# Patient Record
Sex: Male | Born: 1960 | Race: Black or African American | Hispanic: No | Marital: Married
Health system: Southern US, Community
[De-identification: ages and names within clinical notes are randomized; demographics above are authoritative.]

## PROBLEM LIST (undated history)

## (undated) DIAGNOSIS — I509 Heart failure, unspecified: Secondary | ICD-10-CM

## (undated) DIAGNOSIS — C649 Malignant neoplasm of unspecified kidney, except renal pelvis: Secondary | ICD-10-CM

## (undated) DIAGNOSIS — I639 Cerebral infarction, unspecified: Secondary | ICD-10-CM

---

## 2005-12-11 DIAGNOSIS — I10 Essential (primary) hypertension: Secondary | ICD-10-CM | POA: Diagnosis present

## 2007-10-29 DIAGNOSIS — Z9889 Other specified postprocedural states: Secondary | ICD-10-CM | POA: Insufficient documentation

## 2009-03-10 ENCOUNTER — Emergency Department: Payer: Self-pay | Admitting: Emergency Medicine

## 2010-03-18 IMAGING — CT CT HEAD WITHOUT CONTRAST
1 series · 16 of 30 positions shown, 20 images · non-contrast
Comparison: none

REASON FOR EXAM: seizure
COMMENTS:

PROCEDURE:     CT  - CT HEAD WITHOUT CONTRAST  - March 10, 2009  [DATE]
RESULT:     Comparison: None
TECHNIQUE: Multiple axial images from the foramen magnum to the vertex were
obtained without IV contrast.

[Series 6: without_ · axial · 0.44mm/px · z∈[+1092,+1232]mm · 16 of 32 slices shown, 20 images]
[im 2/32  brain]
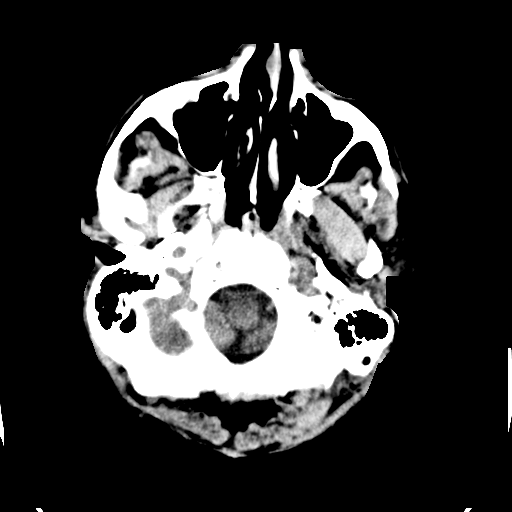
[im 2/32  bone]
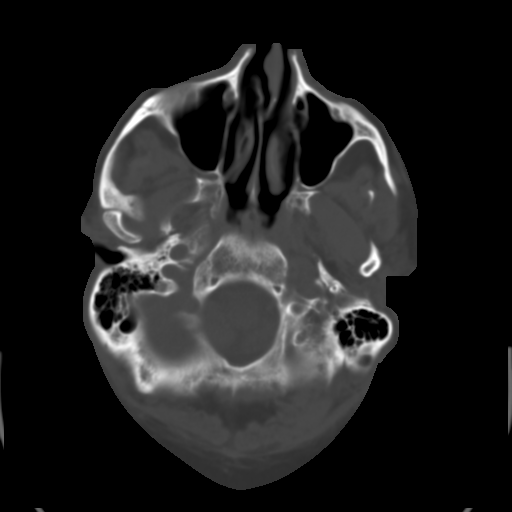
[im 4/32  brain]
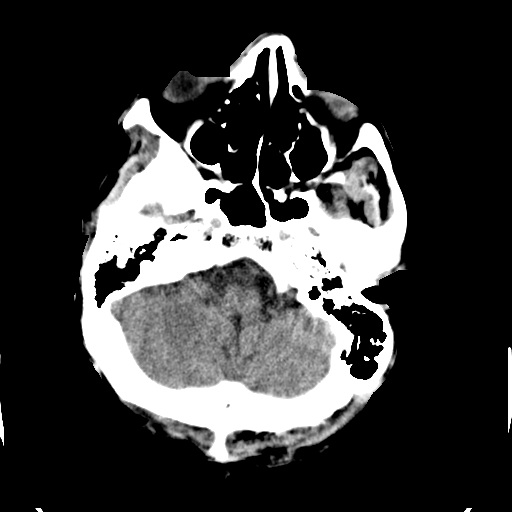
[im 6/32  brain]
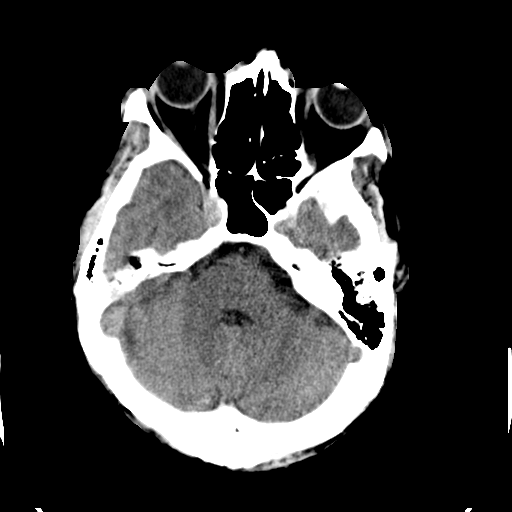
[im 8/32  brain]
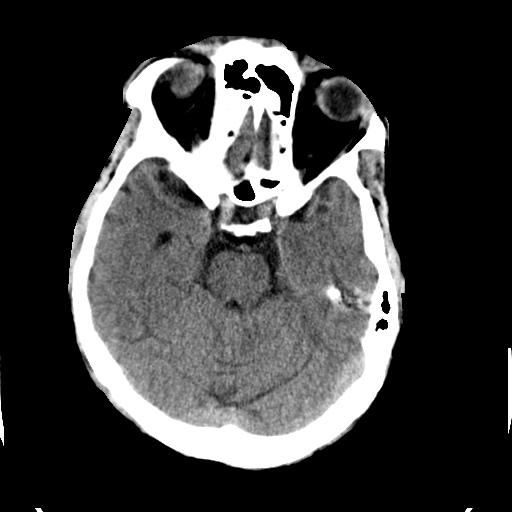
[im 9/32  brain]
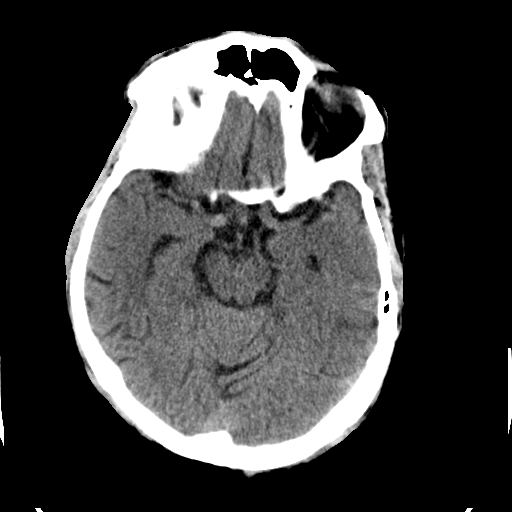
[im 9/32  bone]
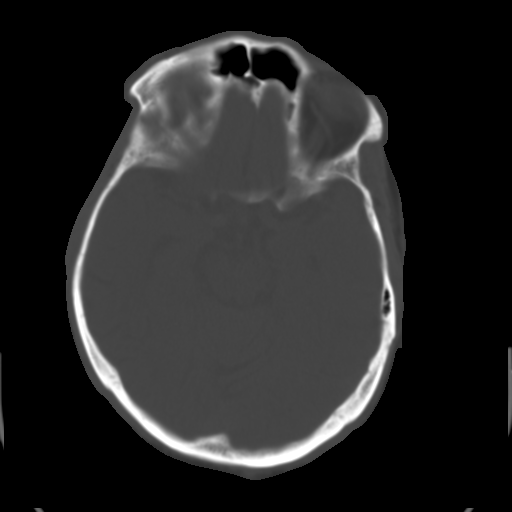
[im 11/32  brain]
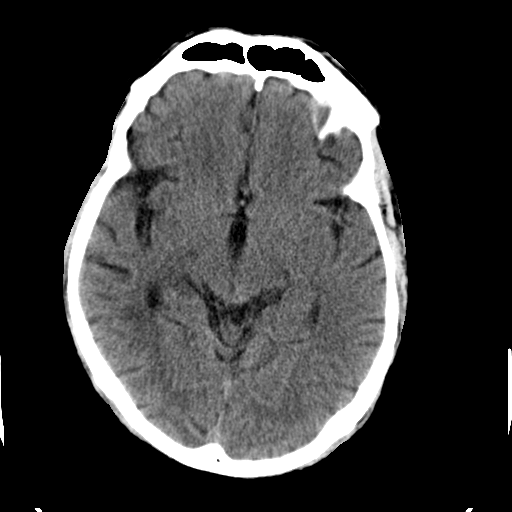
[im 13/32  brain]
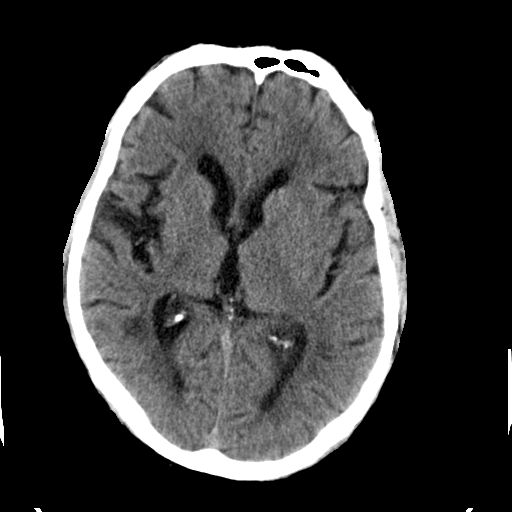
[im 15/32  brain]
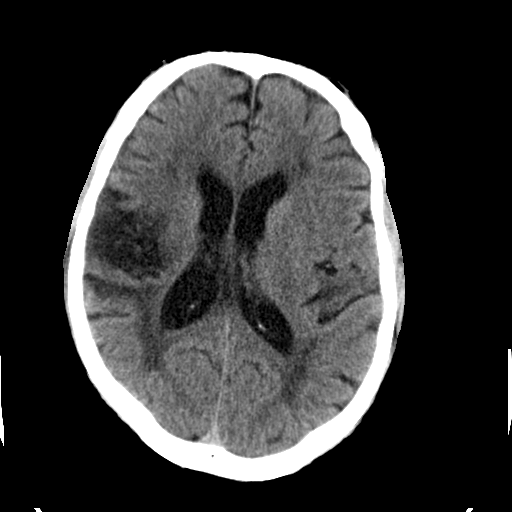
[im 17/32  brain]
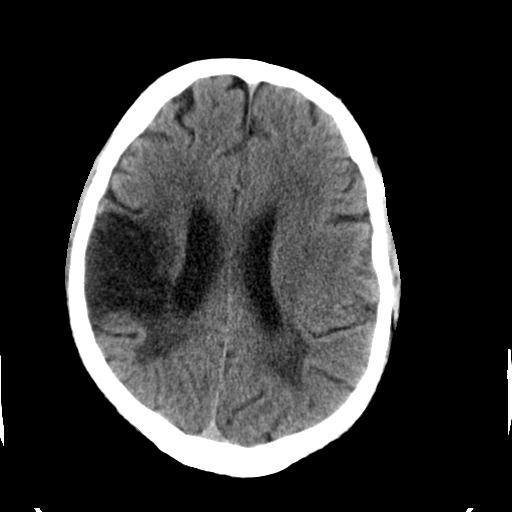
[im 17/32  bone]
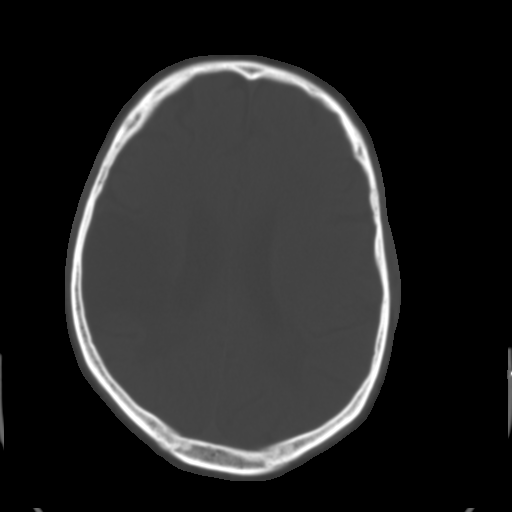
[im 19/32  brain]
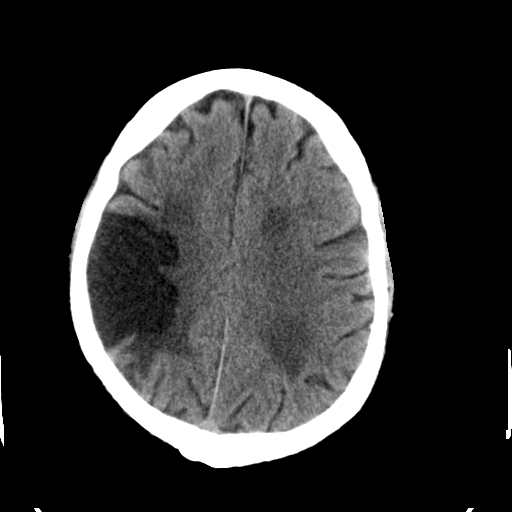
[im 21/32  brain]
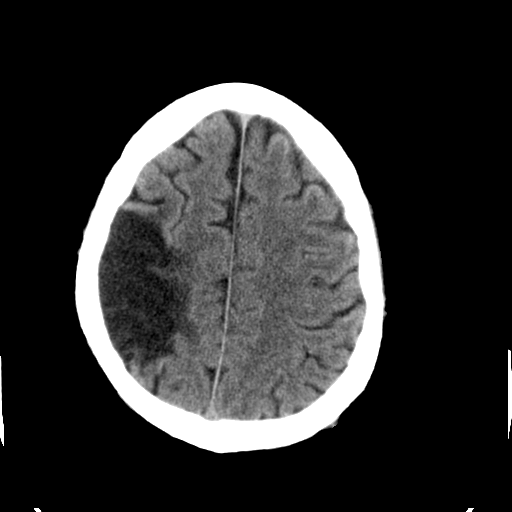
[im 23/32  brain]
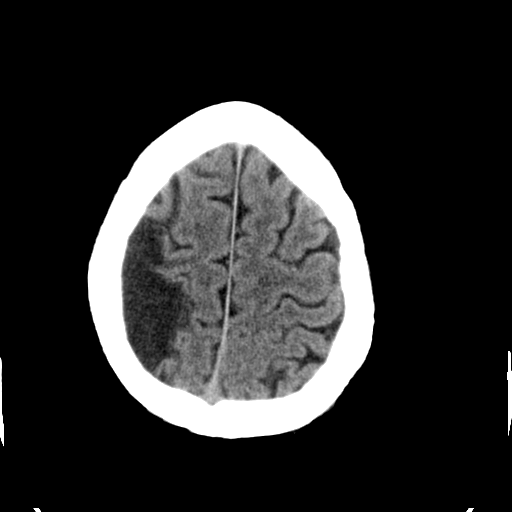
[im 24/32  brain]
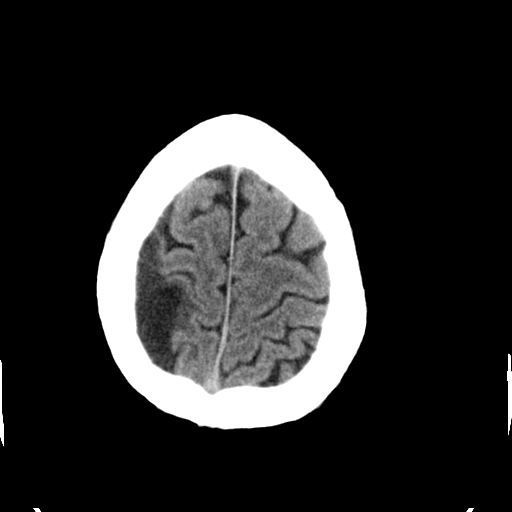
[im 24/32  bone]
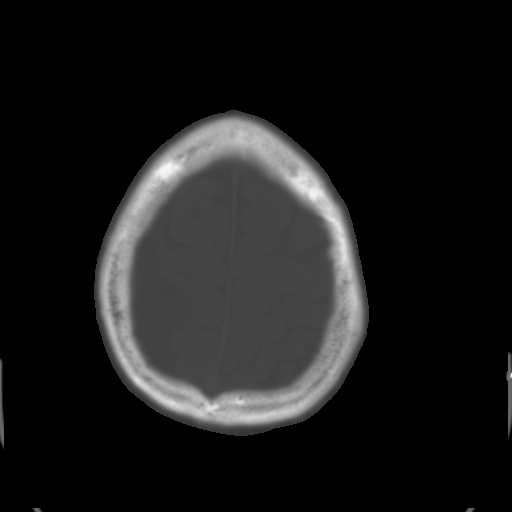
[im 26/32  brain]
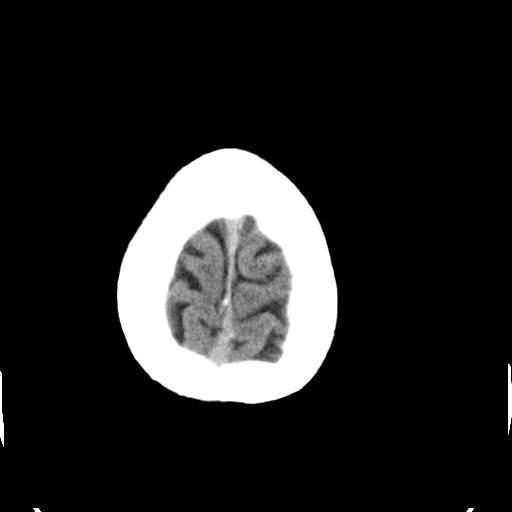
[im 28/32  brain]
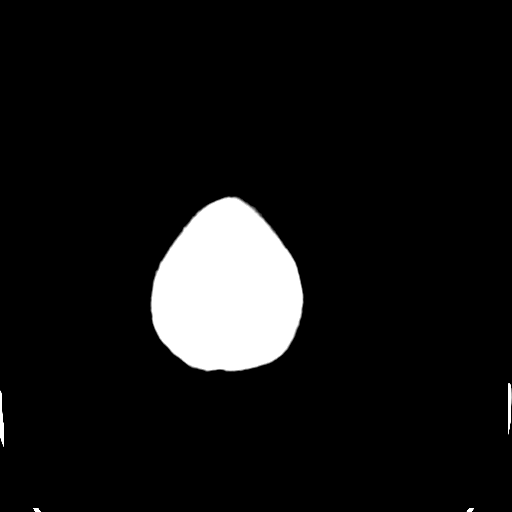
[im 30/32  brain]
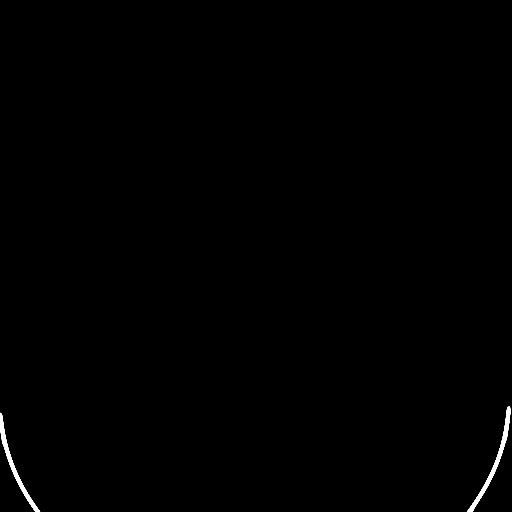

[16 of 30 positions shown; findings below may reference images not displayed]

FINDINGS: There is no evidence for mass effect, midline shift, or extra-axial fluid
collections. There is generalized cerebral atrophy. There is periventricular
white matter low attenuation likely secondary to microangiopathy. There is
no evidence for space-occupying lesion or intracranial hemorrhage. There is
no evidence for cortical-based area of infarction. There is an area of
low-attenuation in the right frontoparietal lobe consistent with
encephalomalacia.

Ventricles and sulci are appropriate for the patient's age. The basal
cisterns are patent.

Visualized portions of the orbits are unremarkable. The paranasal sinuses
and mastoid air cells are unremarkable.

The osseous structures are unremarkable.
IMPRESSION: No acute intracranial process.

## 2010-08-03 ENCOUNTER — Emergency Department: Payer: Self-pay | Admitting: Emergency Medicine

## 2011-08-11 IMAGING — CT CT HEAD WITHOUT CONTRAST
2 series · 16 of 30 positions shown, 20 images · non-contrast
Comparison: none

REASON FOR EXAM: SEIZURE
COMMENTS:

[Series 2: without · axial · non-contrast · 0.45mm/px · z∈[+185,+315]mm · 13 of 32 slices shown, 17 images]
[im 3/32  brain]
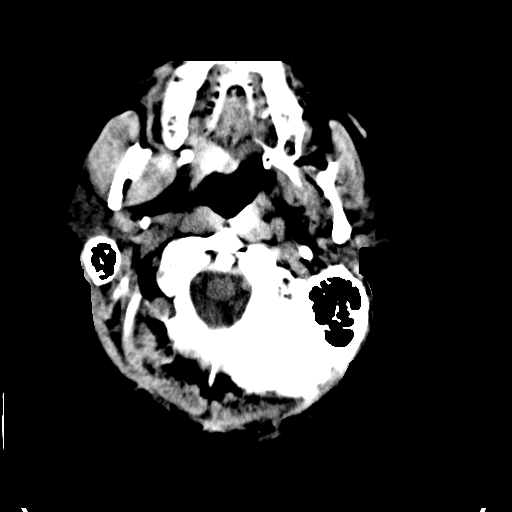
[im 3/32  bone]
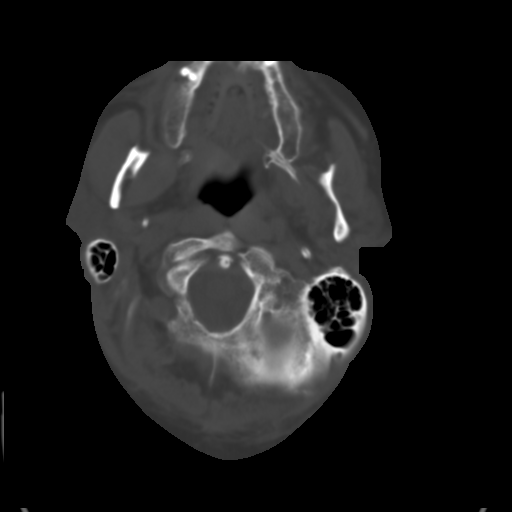
[im 5/32  brain]
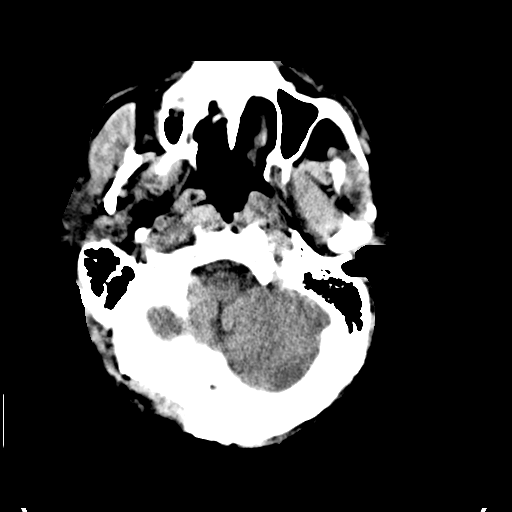
[im 7/32  brain]
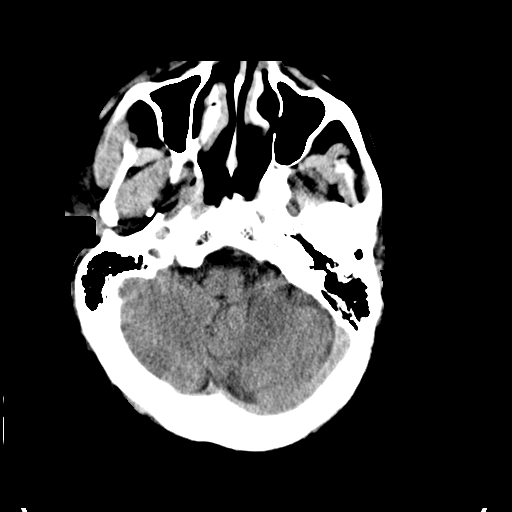
[im 9/32  brain]
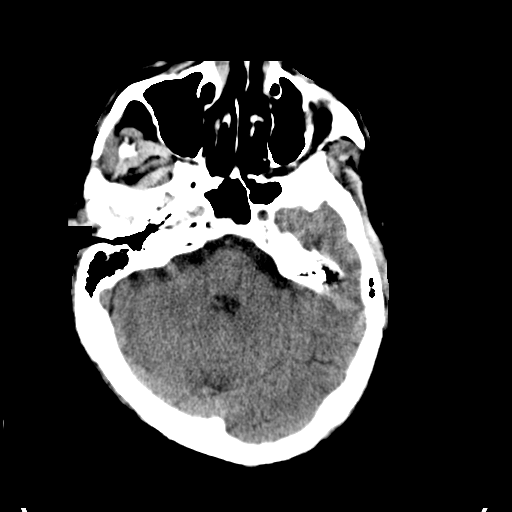
[im 12/32  brain]
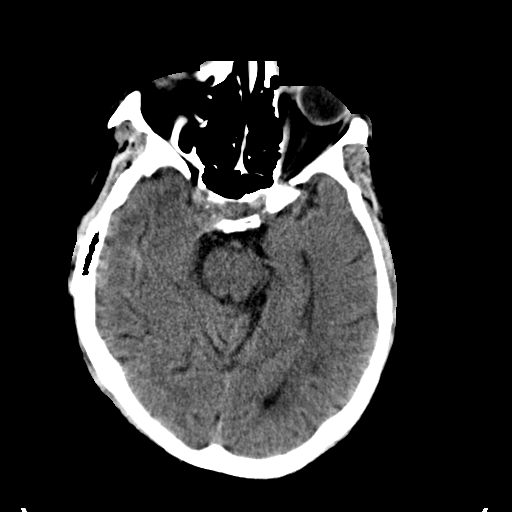
[im 12/32  bone]
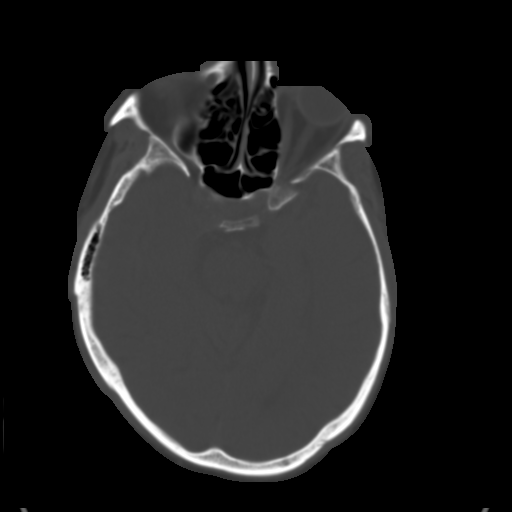
[im 14/32  brain]
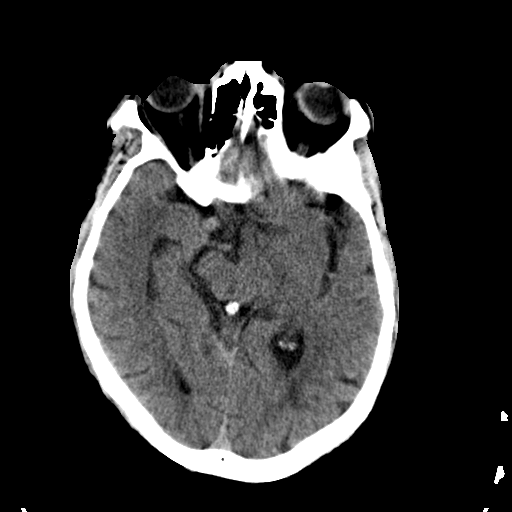
[im 16/32  brain]
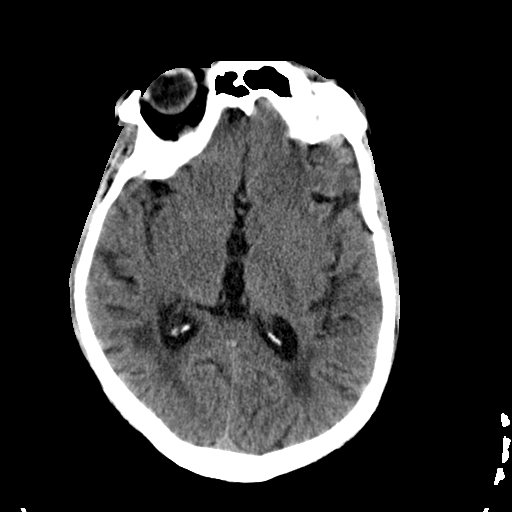
[im 18/32  brain]
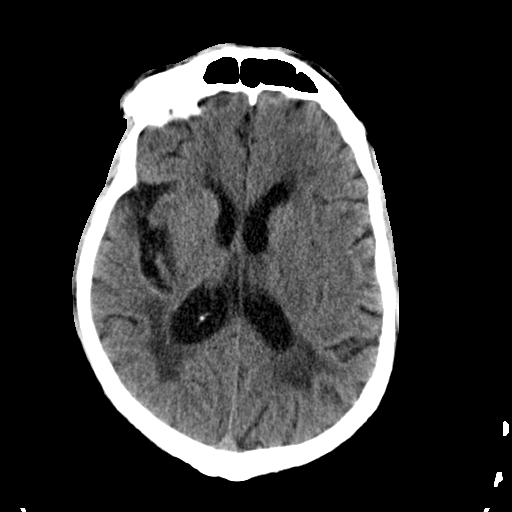
[im 20/32  brain]
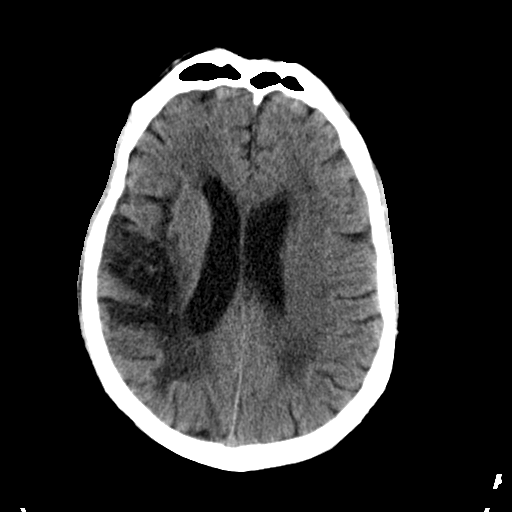
[im 20/32  bone]
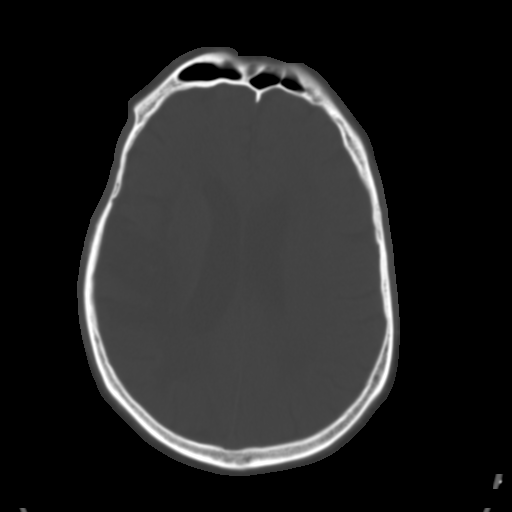
[im 23/32  brain]
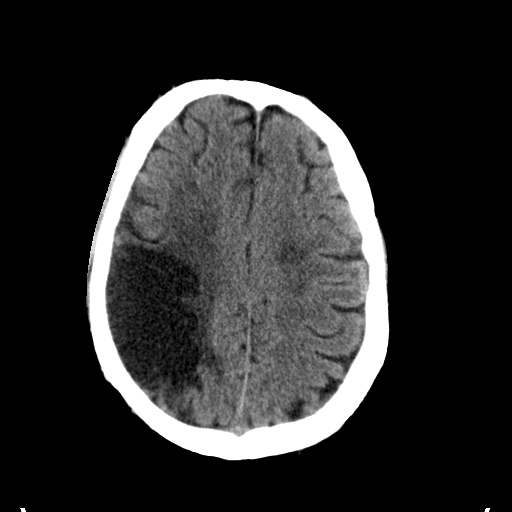
[im 25/32  brain]
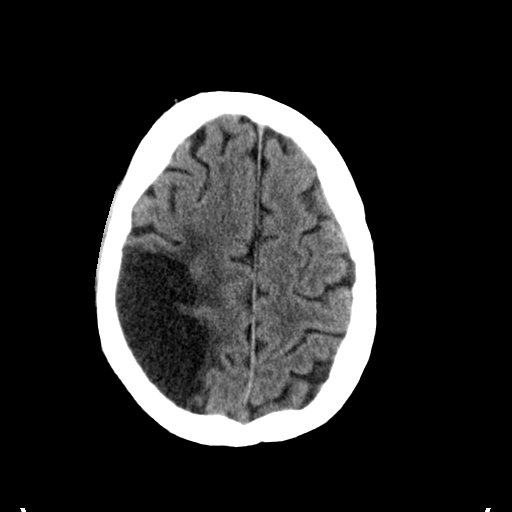
[im 27/32  brain]
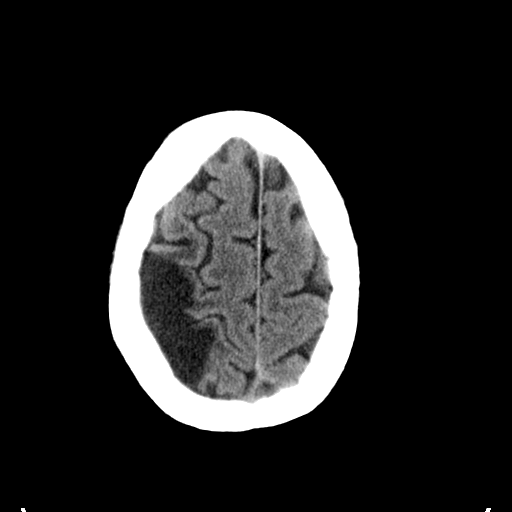
[im 29/32  brain]
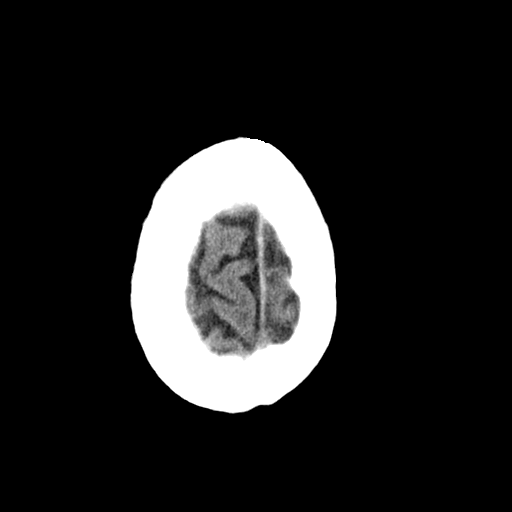
[im 29/32  bone]
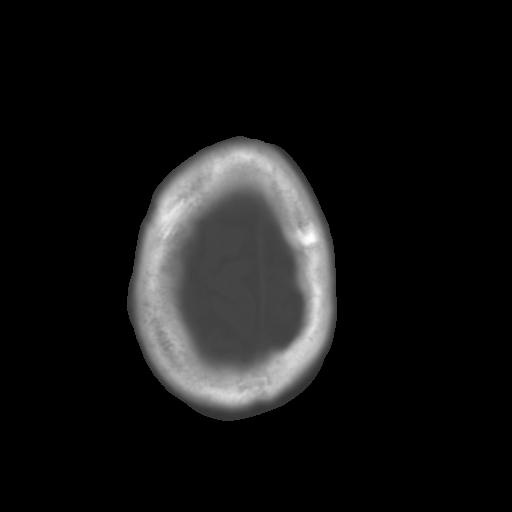

[Series 3: bone · axial · 0.45mm/px · z∈[+185,+230]mm · 3 of 32 slices shown]
[im 3/32  bone]
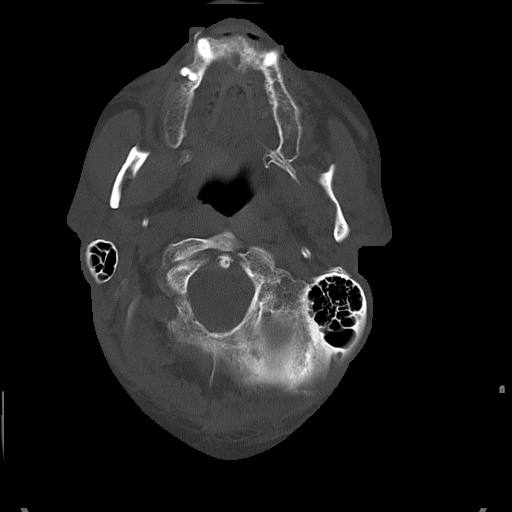
[im 7/32  bone]
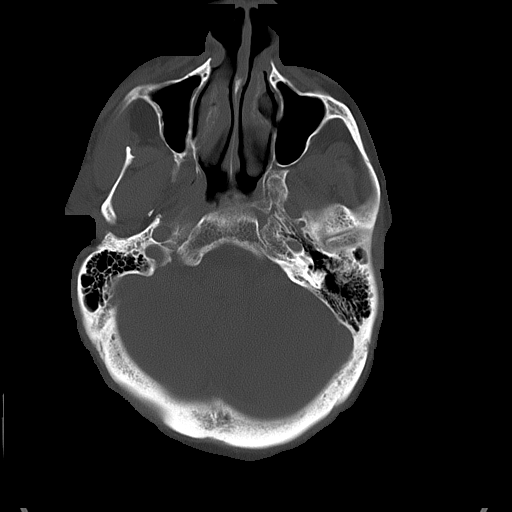
[im 12/32  bone]
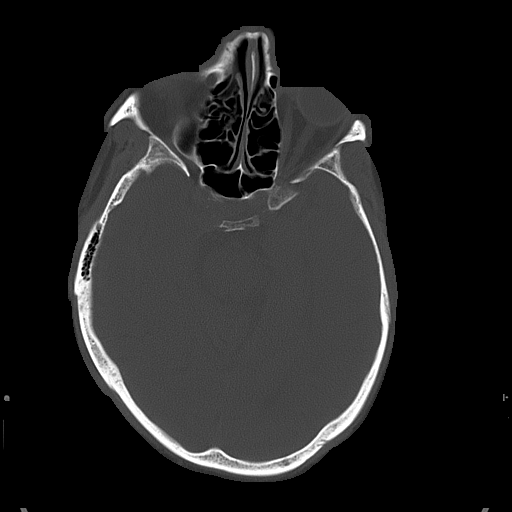

[16 of 30 positions shown; findings below may reference images not displayed]

PROCEDURE:     CT  - CT HEAD WITHOUT CONTRAST  - August 03, 2010  [DATE]

RESULT:     Noncontrast emergent CT of the brain is compared to the previous
examination from 03/10/2009.

There is an old right parietal infarct. There is atrophy with
low-attenuation diffusely in the periventricular and subcortical white
matter regions most consistent with chronic small vessel ischemic disease.
There is no evidence of intracranial hemorrhage, mass effect or midline
shift. The included sinuses and mastoids show grossly normal aeration. The
calvarium appears intact.
IMPRESSION: 1.     Old right-sided infarct.
2.     No acute intracranial abnormality.
3.     Evidence of atrophy with chronic small vessel ischemic
disease.

## 2019-11-21 DIAGNOSIS — C642 Malignant neoplasm of left kidney, except renal pelvis: Secondary | ICD-10-CM | POA: Insufficient documentation

## 2020-02-24 ENCOUNTER — Ambulatory Visit: Payer: Self-pay | Attending: Internal Medicine

## 2020-02-24 DIAGNOSIS — Z23 Encounter for immunization: Secondary | ICD-10-CM

## 2020-02-24 NOTE — Progress Notes (Signed)
   Covid-19 Vaccination Clinic  Name:  Cameron Klein    MRN: [id number] DOB: 05/31/1961  02/24/2020  [name] was observed post Covid-19 immunization for 15 minutes without incident. He was provided with Vaccine Information Sheet and instruction to access the V-Safe system.   [name] was instructed to call 911 with any severe reactions post vaccine: Marland Kitchen Difficulty breathing  . Swelling of face and throat  . A fast heartbeat  . A bad rash all over body  . Dizziness and weakness   Immunizations Administered    Name Date Dose VIS Date Route   Pfizer COVID-19 Vaccine 02/24/2020  5:50 PM 0.3 mL 11/10/2019 Intramuscular   Manufacturer: [employer], [employer]   Lot: [id number]   NDC: [address]-3

## 2020-03-16 ENCOUNTER — Ambulatory Visit: Payer: Self-pay | Attending: Internal Medicine

## 2020-03-16 DIAGNOSIS — Z23 Encounter for immunization: Secondary | ICD-10-CM

## 2020-03-16 NOTE — Progress Notes (Signed)
.     Covid-19 Vaccination Clinic  Name:  Cameron Klein    MRN: [id number] DOB: 1961-04-17  03/16/2020  Mr. Cameron Klein was observed post Covid-19 immunization for 15 minutes without incident. He was provided with Vaccine Information Sheet and instruction to access the V-Safe system.   Mr. Cameron Klein was instructed to call 911 with any severe reactions post vaccine: Marland Kitchen Difficulty breathing  . Swelling of face and throat  . A fast heartbeat  . A bad rash all over body  . Dizziness and weakness   Immunizations Administered    Name Date Dose VIS Date Route   Pfizer COVID-19 Vaccine 03/16/2020  5:45 PM 0.3 mL 11/10/2019 Intramuscular   Manufacturer: [employer], [employer]   Lot: [id number]   NDC: [address]-2

## 2020-04-30 DIAGNOSIS — G500 Trigeminal neuralgia: Secondary | ICD-10-CM | POA: Diagnosis present

## 2021-04-29 DIAGNOSIS — G8190 Hemiplegia, unspecified affecting unspecified side: Secondary | ICD-10-CM | POA: Insufficient documentation

## 2022-05-26 DIAGNOSIS — I2510 Atherosclerotic heart disease of native coronary artery without angina pectoris: Secondary | ICD-10-CM | POA: Diagnosis present

## 2023-03-08 DIAGNOSIS — E1129 Type 2 diabetes mellitus with other diabetic kidney complication: Secondary | ICD-10-CM | POA: Insufficient documentation

## 2023-11-04 NOTE — H&P (Signed)
-------------------------------------------------------------------------------   Attestation signed by Drusilla Elsie Lupita DOUGLAS, MD at 11/04/23 1053 I agree with the findings documented in this pre-procedure note. I personally saw and examined the patient with the cardiology fellow and was present for all parts of the procedure.   Tripp Elsie Lupita Drusilla DOUGLAS, MD, MS, [facility] Trans[facility] Division of Cardiology  -------------------------------------------------------------------------------    Card[facility] Catheterization Laboratory [place], [place] Tel: [phone]    Fax: [phone]    HISTORY & PHYSICAL ASSESSMENT  PCP: [place] Grizzle, MD Phone:  [phone] Fax:  [phone]  Referring Physicians: Pcp, Unknown Per Patient No address on file    Procedures to be performed: Cardiomems implantation  Indication: Chronic heart failure  Consent: I hereby certify that the nature, purpose, benefits, usual and most frequent risks of, and alternatives to, the operation or procedure have been explained to the patient (or person authorized to sign for the patient) either by a physician or by the provider who is to perform the operation or procedure, that the patient has had an opportunity to ask questions, and that those questions have been answered. The patient or the patient's representative has been advised that selected tasks may be performed by assistants to the primary health care provider(s). I believe that the patient (or person authorized to sign for the patient) understands what has been explained, and has consented to the operation or procedure. _____________________________________________________________________  HISTORY: 62 yo male with history of CHF (NICM, EF 35%) with primary prevention ICD, AF on apixaban  with difficulties managing volume who is therefore referred for cardiomems implantation for volume management. He has  held her apixaban  for the last 3 days.   There were no vitals taken for this visit. General: Alert, NAD, sitting up in bed HEENT: Sclera anicteric, MMM, no LAD Cardiac: normal rate, regular rhythm, no murmurs rubs or gallops. No JVD Pulmonary: CTAB, no increased WOB Abdomen: Soft, non-tender, non distended. NABS Extremities: 1+ lower extremity Neuro: AAOx4. No focal deficits   Labs and imaging were reviewed

## 2023-12-04 DIAGNOSIS — I4819 Other persistent atrial fibrillation: Secondary | ICD-10-CM | POA: Diagnosis present

## 2024-01-02 DIAGNOSIS — N1832 Chronic kidney disease, stage 3b: Secondary | ICD-10-CM | POA: Diagnosis present

## 2024-03-07 NOTE — Progress Notes (Signed)
 Referring Provider: Campbell Lenon PARAS, AGNP [address] [place],  [place] [address]  Primary Provider: [place] Grizzle, MD [address] [facility] [place] [address]  Other Providers:  Dr Von HOUSTON EP Follow Up Note  Reason for Visit: Cameron Klein is a 63 y.o. male being seen for routine visit and continued care of his dual chamber ICD.  Assessment & Plan:  1. NICM, HFrEF s/p Environmental manager dual chamber ICD Device checked by device nurse I have reviewed and agree with the findings Normal device function A pace - 44% V pace - 4% Events - 3 NSVT, appear atrial driven Battery - 12 years Underlying Rhythm - SR Device Dependent - no Continue remotes   Bumex  Metoprolol  succinate 200mg  daily Entresto 97-103 bid Spironolactone      2. Paroxysmal Atrial Fibrillation, Typical Atrial Flutter s/p typical flutter ablation x2 CHA2DS2-VASc - 5 (cva2, hf, dm, htn) Apixaban  5mg  bid Recommend lifelong anticoagulation    3. 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  EKG showing single morphology PVCs but will placed Zio for better assessment of PVC morphology burden. Briefly discussed PVC treatment options including catheter ablation vs antiarrhythmic (sotalol)   ECG: AV pacing with frequent PVCs, 68bpm.  See official ECG report.  Follow-up: Return for annual device check.  History of Present Illness: Cameron Klein is a 63 y.o. male with a past medical history of atrial flutter, atrial fibrillation, NICM, prior R MCA stroke .  05/05/22 MRI negative for amyloid   Interval History: doing well from the device standpoint. No pain or discomfort. Does not feel his PVCs generally. No syncope.    Cardiovascular History & Procedures:  Cath / PCI: 03/02/2022 L/RHC Non obstructive coronary artery disease including 50% proximal LAD stenosis (FFR=0.91), and 70% OM1 stenosis (FFR= 0.95) Pulmonary hypertension with mean PA  pressure of 29mm Hg, elevated PVR at 3 wood units. Mildly elevated right right pressure (RA mean 8 mm Hg) Normal left heart filling pressures (mPCWP =14 mmHg, LVEDP ). 50% right common iliac lesion (not fully characterized because of desire to limit contrast dye) without any pressure gradient **  CV Surgery: **  EP Procedures and Devices: 12/07/2022 [facility] dual chamber ICD implant Ongoing typical atrial flutter: successfully ablated with completion of CTI line (reconnected at the tricuspid annular end)   10/26/2007 typical atrial flutter ablation **  Non-Invasive Evaluation(s): Echo: 12/06/2022 TTE   1. The left ventricle is normal in size with mildly increased wall thickness.   2. LV systolic function severely decreased, LVEF visually estimated at 25%.   3. There is mild mitral valve regurgitation.   4. The left atrium is mildly dilated in size.   5. Right ventricle is normal in size, with moderately reduced systolic function. **  Holter: **  Cardiac CT/MRI/Nuclear Tests: 06/11/2022 Nuclear Stress 1.Cardiomegaly with increased thickness of the left ventricle, better appreciated on recent cardiac MRI 2.Few punctate calcification in the coronary arteries. 3.Bilateral gynecomastia. 4.Bibasilar atelectasis. 5.Sequela of bilateral interpolar renal cryoablation better appreciated on recent MRI. Left renal cysts are present, reference the interpolar cystic attenuation lesion measuring 2.4 cm (CT 106). 6.Intermediate attenuation left adrenal nodule measuring 1.9 cm (CT 86), previously described as an adenoma.  **            Other Past Medical History: See below for the complete EPIC list of past medical and surgical history.    Allergies: Jardiance [empagliflozin] and Topiramate  Current Medications:  Current Outpatient Medications on File Prior to Visit  Medication Sig  . acetaminophen  (TYLENOL ) 500 MG tablet Take 2 tablets (1,000 mg total) by mouth every eight (8)  hours as needed.  SABRA acetone, urine, test (KETONE URINE TEST) Strp 1 strip by Miscellaneous route daily as needed (for sympstoms of DKA).  SABRA albuterol HFA 90 mcg/actuation inhaler Inhale 2 puffs every six (6) hours as needed.  . apixaban  (ELIQUIS ) 5 mg Tab Take 1 tablet (5 mg total) by mouth two (2) times a day.  . blood-glucose meter,continuous (DEXCOM G7 RECEIVER) Misc Use as instructed with G7 sensors  . blood-glucose sensor (DEXCOM G7 SENSOR) Devi Dispense DexCom G7 sensors; Use 1 sensor q 10 days  . bumetanide  (BUMEX ) 1 MG tablet Take 2 tablets (2 mg total) by mouth daily AND 1 tablet (1 mg total) daily with lunch. May also take 1 tablet (1 mg total) daily as needed (for weight gain or fluid symptoms).  . cholecalciferol, vitamin D3-25 mcg, 1,000 unit,, 25 mcg (1,000 unit) capsule Take 1 capsule (25 mcg total) by mouth daily as needed.  . dexAMETHasone (DECADRON) 1 MG tablet Take two  pill at 11 pm the night before your 8 AM blood test  . docusate sodium (COLACE) 100 MG capsule Take 1 capsule (100 mg total) by mouth two (2) times a day.  . ezetimibe (ZETIA) 10 mg tablet Take 1 tablet by mouth once daily  . hydrALAZINE (APRESOLINE) 25 MG tablet TAKE 1 TABLET BY MOUTH THREE TIMES DAILY  . [Paused] insulin  NPH and regular human (HUMULIN 70/30 U-100 KWIKPEN) 100 unit/mL (70-30) InPn injection pen Inject 12 units under the skin in the morning and 10 units in the evening.  SABRA ketoconazole (NIZORAL) 2 % cream Apply 1 application topically daily.  SABRA ketoconazole (NIZORAL) 2 % shampoo Apply 1 application. topically Two (2) times a week.  . metoPROLOL  succinate (TOPROL -XL) 200 MG 24 hr tablet Take 1 tablet (200 mg total) by mouth daily.  . OXcarbazepine  (TRILEPTAL ) 300 MG tablet Take 1 tablet (300 mg total) by mouth two (2) times a day.  . phenytoin  (DILANTIN ) 100 MG ER capsule Take 2 capsules (200 mg total) by mouth at bedtime.  . rosuvastatin (CRESTOR) 40 MG tablet Take 1 tablet (40 mg total) by mouth  daily.  . sacubitril-valsartan (ENTRESTO) 97-103 mg tablet Take 1 tablet by mouth two (2) times a day.  . semaglutide (OZEMPIC) 1 mg/dose (4 mg/3 mL) PnIj injection Inject 1 mg under the skin once a week.  . spironolactone  (ALDACTONE ) 25 MG tablet TAKE 3 TABLETS BY MOUTH IN THE MORNING AND 2 TABS IN THE EVENING  . triamcinolone (KENALOG) 0.1 % cream APPLY A PEA SIZED AMOUNT OR SMALLER TO AFFECTED AREA ONCE DAILY AS NEEDED   No current facility-administered medications on file prior to visit.    Family History: The patient's family history includes Nephrolithiasis in his sister; Stroke in his mother.  Social history: He  reports that he quit smoking about 39 years ago. His smoking use included cigarettes. He started smoking about 64 years ago. He has a 6.3 pack-year smoking history. He has been exposed to tobacco smoke. He has never used smokeless tobacco. He reports that he does not currently use alcohol. He reports that he does not use drugs.  Review of Systems: As per HPI and as follows.  Rest of the review of ten systems is negative or unremarkable except as stated above.  Physical Exam: VITAL SIGNS:  Vitals:   03/08/24 1017  BP: 100/66  SpO2: 98%     Wt Readings from Last 3 Encounters:  03/08/24 94.3 kg (208 lb)  03/08/24 94.4 kg (208 lb 3.2 oz)  02/29/24 94.8 kg (209 lb)     Today's Body mass index is 29.87 kg/m.   Height: 177.8 cm (5' 10)  GENERAL: well-appearing in no acute distress HEENT: Normocephalic and atraumatic. Conjunctivae and sclerae clear and anicteric.   NECK: Supple.  CARDIOVASCULAR: Rate and rhythm are regular.   RESPIRATORY: Normal respiratory effort. There are no wheezes. ABDOMEN: Soft, non-tender, Abdomen nondistended.   EXTREMITIES: There is no pedal edema, bilaterally.  SKIN: No rashes, ecchymosis or petechiae. Warm, well perfused. Well healed left sided ICD scar  NEURO/PSYCH: Alert and oriented x 3. Affect appropriate. Nonfocal  Pertinent  Laboratory Studies:  Clinical Support on 03/08/2024  Component Date Value Ref Range Status  . EKG Ventricular Rate 03/08/2024 68  BPM Preliminary  . EKG Atrial Rate 03/08/2024 66  BPM Preliminary  . EKG P-R Interval 03/08/2024 220  ms Preliminary  . EKG QRS Duration 03/08/2024 168  ms Preliminary  . EKG Q-T Interval 03/08/2024 456  ms Preliminary  . EKG QTC Calculation 03/08/2024 484  ms Preliminary  . EKG Calculated P Axis 03/08/2024 13  degrees Preliminary  . EKG Calculated R Axis 03/08/2024 -53  degrees Preliminary  . EKG Calculated T Axis 03/08/2024 102  degrees Preliminary  . QTC Fredericia 03/08/2024 475  ms Preliminary  Appointment on 02/29/2024  Component Date Value Ref Range Status  . Sodium 02/29/2024 143  135 - 145 mmol/L Final  . Potassium 02/29/2024 4.6  3.4 - 4.8 mmol/L Final  . Chloride 02/29/2024 101  98 - 107 mmol/L Final  . CO2 02/29/2024 27.8  20.0 - 31.0 mmol/L Final  . Anion Gap 02/29/2024 14  5 - 14 mmol/L Final  . BUN 02/29/2024 54 (H)  9 - 23 mg/dL Final  . Creatinine 95/98/7974 2.30 (H)  0.73 - 1.18 mg/dL Final  . BUN/Creatinine Ratio 02/29/2024 23   Final  . eGFR CKD-EPI (2021) Male 02/29/2024 31 (L)  >=60 mL/min/1.38m2 Final  . Glucose 02/29/2024 184 (H)  70 - 179 mg/dL Final  . Calcium 95/98/7974 9.6  8.7 - 10.4 mg/dL Final  . Albumin 95/98/7974 3.9  3.4 - 5.0 g/dL Final  . Total Protein 02/29/2024 7.6  5.7 - 8.2 g/dL Final  . Total Bilirubin 02/29/2024 0.3  0.3 - 1.2 mg/dL Final  . AST 95/98/7974 22  <=34 U/L Final  . ALT 02/29/2024 35  10 - 49 U/L Final  . Alkaline Phosphatase 02/29/2024 183 (H)  46 - 116 U/L Final  . WBC 02/29/2024 9.0  3.6 - 11.2 10*9/L Final  . RBC 02/29/2024 4.59  4.26 - 5.60 10*12/L Final  . HGB 02/29/2024 13.2  12.9 - 16.5 g/dL Final  . HCT 95/98/7974 39.8  39.0 - 48.0 % Final  . MCV 02/29/2024 86.6  77.6 - 95.7 fL Final  . MCH 02/29/2024 28.7  25.9 - 32.4 pg Final  . MCHC 02/29/2024 33.1  32.0 - 36.0 g/dL Final  . RDW  95/98/7974 13.0  12.2 - 15.2 % Final  . MPV 02/29/2024 9.7  6.8 - 10.7 fL Final  . Platelet 02/29/2024 202  150 - 450 10*9/L Final  . nRBC 02/29/2024 0  <=4 /100 WBCs Final  . Neutrophils % 02/29/2024 60.5  % Final  . Lymphocytes % 02/29/2024 31.2  % Final  .  Monocytes % 02/29/2024 6.3  % Final  . Eosinophils % 02/29/2024 1.0  % Final  . Basophils % 02/29/2024 1.0  % Final  . Absolute Neutrophils 02/29/2024 5.4  1.8 - 7.8 10*9/L Final  . Absolute Lymphocytes 02/29/2024 2.8  1.1 - 3.6 10*9/L Final  . Absolute Monocytes 02/29/2024 0.6  0.3 - 0.8 10*9/L Final  . Absolute Eosinophils 02/29/2024 0.1  0.0 - 0.5 10*9/L Final  . Absolute Basophils 02/29/2024 0.1  0.0 - 0.1 10*9/L Final  . T Albumin 02/29/2024 4.2  3.5 - 5.0 g/dL Final  . Alpha-1 Globulin 02/29/2024 0.3  0.2 - 0.5 g/dL Final  . Alpha-2 Globulin 02/29/2024 0.8  0.5 - 1.1 g/dL Final  . Beta-1 Globulin 02/29/2024 0.4  0.3 - 0.6 g/dL Final  . Beta-2 Globulin 02/29/2024 0.3  0.2 - 0.6 g/dL Final  . Gammaglobulin 02/29/2024 1.1  0.5 - 1.5 g/dL Final  . M Spike 95/98/7974 0.8 (H)  Not Present g/dL Final  . SPE Interpretation 02/29/2024    Final  . Immunofixation Electrophoresis, Se* 02/29/2024    Final  . Total Protein 02/29/2024 7.2  g/dL Final  . Total IgG 95/98/7974 1,317  650 - 1,600 mg/dL Final  . IgM 95/98/7974 26 (L)  40 - 230 mg/dL Final  . IgA 95/98/7974 49.0 (L)  70.0 - 400.0 mg/dL Final  . Total Protein 02/29/2024 7.2  5.7 - 8.2 g/dL Final  . Kappa Free, Serum 02/29/2024 7.08 (H)  0.33 - 1.94 mg/dL Final  . Lambda Free, Serum 02/29/2024 2.34  0.57 - 2.63 mg/dL Final  . K/L FLC Ratio 02/29/2024 3.03 (H)  0.26 - 1.65 Final  Orders Only on 02/03/2024  Component Date Value Ref Range Status  . Session datetime 02/03/2024 2024-02-03 05:56:00   Final  . Session type 02/03/2024 Remote Scheduled   Final  . Manufacturer 02/03/2024 BSX   Final  . Device type 02/03/2024 ICD   Final  . Model number 02/03/2024 [id number]   Final  .  Serial number 02/03/2024 [id number]   Final  . Implant date 02/03/2024 2022-12-07   Final  . Battery remaining percentage 02/03/2024 100.00   Final  . Battery remaining longevity 02/03/2024 150.0   Final  . Battery status 02/03/2024 Beginning of Service   Final  . Capacitor charge time 02/03/2024 10.400   Final  . Atrial pacing percent 02/03/2024 46.00   Final  . RV pacing percent 02/03/2024 4.00   Final  . AT burden percent 02/03/2024 0.00   Final  . RA intrinsic amplitude 02/03/2024 2.800   Final  . RA programmed sensitivity 02/03/2024 0.25   Final  . RA impedance 02/03/2024 689   Final  . RA threshold amplitude 02/03/2024 0.400   Final  . RA threshold pulsewidth 02/03/2024 0.4   Final  . RA threshold date 02/03/2024 2024-02-02   Final  . RA pacing amplitude 02/03/2024 2.000   Final  . RA pacing pulsewidth 02/03/2024 0.4   Final  . RV intrinsic amplitude 02/03/2024 25.000   Final  . RV programmed sensitivity 02/03/2024 0.60   Final  . RV impedance 02/03/2024 431   Final  . RV threshold amplitude 02/03/2024 0.600   Final  . RV threshold pulsewidth 02/03/2024 0.4   Final  . RV threshold date 02/03/2024 2024-02-02   Final  . RV pacing amplitude 02/03/2024 2.000   Final  . RV pacing pulsewidth 02/03/2024 0.4   Final  . Nola mode 02/03/2024 DDD   Final  .  Lower rate 02/03/2024 60   Final  . Mode switch rate 02/03/2024 170   Final  . Upper tracking rate 02/03/2024 130   Final  . Upper sensor rate 02/03/2024 130   Final  . Paced AV delay 02/03/2024 180   Final  . Sensed AV delay 02/03/2024 180   Final  . Shocks delivered recent 02/03/2024 0   Final  . Shocks aborted recent 02/03/2024 0   Final  . ATP delivered recent 02/03/2024 0   Final  . Shock measured impedance 02/03/2024 68   Final  . Zone type 02/03/2024 VF   Final  . Rate 1 02/03/2024 240   Final  . Therapies 02/03/2024 Burst,41J,41J,41J x 6   Final  . Status 02/03/2024 On   Final  . Zone id 02/03/2024 1   Final  . Zone type  02/03/2024 VT   Final  . Rate 1 02/03/2024 185   Final  . Therapies 02/03/2024 6 x Burst+Scan,41J,41J,41J x 4   Final  . Status 02/03/2024 On   Final  . Zone id 02/03/2024 2   Final  . Zone type 02/03/2024 VT   Final  . Rate 1 02/03/2024 165   Final  . Status 02/03/2024 Monitor   Final  . Zone id 02/03/2024 3   Final  Office Visit on 01/25/2024  Component Date Value Ref Range Status  . Triglycerides 01/25/2024 129  0 - 150 mg/dL Final  . Cholesterol 97/74/7974 164  <=200 mg/dL Final  . HDL 97/74/7974 47  40 - 60 mg/dL Final  . LDL Calculated 01/25/2024 91  40 - 99 mg/dL Final  . VLDL Cholesterol Cal 01/25/2024 25.8  12 - 42 mg/dL Final  . Chol/HDL Ratio 01/25/2024 3.5  1.0 - 4.5 Final  . Non-HDL Cholesterol 01/25/2024 117  70 - 130 mg/dL Final  . FASTING 97/74/7974 No   Final  . Renin Activity 01/25/2024 2.9  ng/mL/h Final  . Aldosterone 01/25/2024 23 (H)  <=21 ng/dL Final  . Normetanephrine, Free 01/25/2024 0.95 (H)  <0.90 nmol/L Final  . Metanephrine, Free 01/25/2024 0.34  <0.50 nmol/L Final  . Glucose, POC 01/25/2024 297 (H)  70 - 179 mg/dL Final  . HGB J8R, POC 97/74/7974 7.3 (H)  <7.0 % Final  . EST AVERAGE GLUCOSE, POC 01/25/2024 163  mg/dL Final  Office Visit on 12/30/2023  Component Date Value Ref Range Status  . PRO-BNP 12/30/2023 1,663.0 (H)  <=300.0 pg/mL Final  . Creat U 12/30/2023 96.1  Undefined mg/dL Final  . Albumin Quantitative, Urine 12/30/2023 5.9  Undefined mg/dL Final  . Albumin/Creatinine Ratio 12/30/2023 61.4 (H)  0.0 - 30.0 ug/mg Final  . WBC 12/30/2023 7.7  3.6 - 11.2 10*9/L Final  . RBC 12/30/2023 4.55  4.26 - 5.60 10*12/L Final  . HGB 12/30/2023 13.2  12.9 - 16.5 g/dL Final  . HCT 98/69/7974 39.2  39.0 - 48.0 % Final  . MCV 12/30/2023 86.1  77.6 - 95.7 fL Final  . MCH 12/30/2023 29.1  25.9 - 32.4 pg Final  . MCHC 12/30/2023 33.7  32.0 - 36.0 g/dL Final  . RDW 98/69/7974 13.1  12.2 - 15.2 % Final  . MPV 12/30/2023 9.9  6.8 - 10.7 fL Final  . Platelet  12/30/2023 197  150 - 450 10*9/L Final  . Sodium 12/30/2023 142  135 - 145 mmol/L Final  . Potassium 12/30/2023 3.8  3.4 - 4.8 mmol/L Final  . Chloride 12/30/2023 100  98 - 107 mmol/L Final  .  CO2 12/30/2023 30.0  20.0 - 31.0 mmol/L Final  . Anion Gap 12/30/2023 12  5 - 14 mmol/L Final  . BUN 12/30/2023 46 (H)  9 - 23 mg/dL Final  . Creatinine 98/69/7974 2.04 (H)  0.73 - 1.18 mg/dL Final  . BUN/Creatinine Ratio 12/30/2023 23   Final  . eGFR CKD-EPI (2021) Male 12/30/2023 36 (L)  >=60 mL/min/1.72m2 Final  . Glucose 12/30/2023 183 (H)  70 - 179 mg/dL Final  . Calcium 98/69/7974 9.4  8.7 - 10.4 mg/dL Final  . Phosphorus 98/69/7974 3.5  2.4 - 5.1 mg/dL Final  . Albumin 98/69/7974 4.0  3.4 - 5.0 g/dL Final  Office Visit on 12/16/2023  Component Date Value Ref Range Status  . Sodium 12/16/2023 140  135 - 145 mmol/L Final  . Potassium 12/16/2023 4.1  3.4 - 4.8 mmol/L Final  . Chloride 12/16/2023 100  98 - 107 mmol/L Final  . CO2 12/16/2023 27.8  20.0 - 31.0 mmol/L Final  . Anion Gap 12/16/2023 12  5 - 14 mmol/L Final  . BUN 12/16/2023 50 (H)  9 - 23 mg/dL Final  . Creatinine 98/83/7974 2.14 (H)  0.73 - 1.18 mg/dL Final  . BUN/Creatinine Ratio 12/16/2023 23   Final  . eGFR CKD-EPI (2021) Male 12/16/2023 34 (L)  >=60 mL/min/1.42m2 Final  . Glucose 12/16/2023 129  70 - 179 mg/dL Final  . Calcium 98/83/7974 9.9  8.7 - 10.4 mg/dL Final  . Albumin 98/83/7974 4.2  3.4 - 5.0 g/dL Final  . Total Protein 12/16/2023 8.2  5.7 - 8.2 g/dL Final  . Total Bilirubin 12/16/2023 0.4  0.3 - 1.2 mg/dL Final  . AST 98/83/7974 27  <=34 U/L Final  . ALT 12/16/2023 44  10 - 49 U/L Final  . Alkaline Phosphatase 12/16/2023 201 (H)  46 - 116 U/L Final  . Magnesium 12/16/2023 2.2  1.6 - 2.6 mg/dL Final  . PRO-BNP 98/83/7974 1,444.0 (H)  <=300.0 pg/mL Final    Lab Results  Component Value Date   PRO-BNP 1,663.0 (H) 12/30/2023   PRO-BNP 1,444.0 (H) 12/16/2023   PRO-BNP 1,977.0 (H) 09/28/2023   PRO-BNP 739 (H)  09/19/2013   Creatinine Whole Blood, POC 1.2 07/27/2017   Creatinine Whole Blood, POC 1.1 12/13/2015   Creatinine 2.30 (H) 02/29/2024   Creatinine 2.04 (H) 12/30/2023   BUN 54 (H) 02/29/2024   BUN 46 (H) 12/30/2023   BUN 15 01/16/2015   BUN 20 05/08/2014   Sodium 143 02/29/2024   Sodium 140 01/16/2015   Potassium 4.6 02/29/2024   Potassium 4.0 01/16/2015   CO2 27.8 02/29/2024   CO2 28 01/16/2015   Magnesium 2.2 12/16/2023   Magnesium 1.8 02/09/2011   Total Bilirubin 0.3 02/29/2024   INR, POC 1.1 04/23/2020   INR, POC 2.8 01/24/2015   INR 1.31 07/25/2023    No results found for: DIGOXIN  Lab Results  Component Value Date   TSH 0.937 02/10/2022   TSH 1.29 07/01/2011   Cholesterol 164 01/25/2024   Cholesterol, Total 221 (H) 08/30/2012   Triglycerides 129 01/25/2024   Triglycerides 156 (H) 08/30/2012   HDL 47 01/25/2024   HDL 56 08/30/2012   Non-HDL Cholesterol 117 01/25/2024   LDL Calculated 91 01/25/2024   LDL Direct 118 05/26/2012   LDL Cholesterol, Calculated 134 08/30/2012    Lab Results  Component Value Date   WBC 9.0 02/29/2024   WBC 8.5 05/08/2014   HGB 13.2 02/29/2024   Hemoglobin, POC 12.7 (L) 03/02/2022  HCT 39.8 02/29/2024   HCT 42.0 05/08/2014   Platelet 202 02/29/2024   Platelet 242 05/08/2014    Past Medical History:  Diagnosis Date  . Atrial fibrillation   . Cancer of kidney   . Diabetic ketoacidosis 12/01/2022  . Dilated cardiomyopathy 10/29/2022  . Enrolled in chronic care management 11/16/2022   A lot of issues right now:   ON [employer] [place] plan which will no longer be covered.  Need a switch.  [place] SHIP [place] in [place] but this needs Follow up.  Missing meds will be catastrophic.   Needs medication reconciliation. Wife manages all meds. He is illiterate.    Isn't sure if he is on jardiance.  Please check.   Can't afford jardiance and entresto.  Please help get meds (I also placed   . Heart failure   . History of atrial  fibrillation 03/14/2013   Atrial fibrillation, unspecified type (CMS-HCC)  Assessment: no palpitations, no abnormal bleeding.  Treatment plan: on Apixiban 5 BID    . Hyperlipemia   . Hypertension   . Hypertension   . OSA (obstructive sleep apnea)   . Other specified diabetes mellitus with hyperglycemia 12/04/2022  . Seizures   . 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 Stroke    left side hemiparesis    Past Surgical History:  Procedure Laterality Date  . CARDIAC CATHETERIZATION    . CHOLECYSTECTOMY    . IR EMBOLIZATION ARTERIAL OTHER THAN HEMORRHAGE  05/27/2023   IR EMBOLIZATION ARTERIAL OTHER THAN HEMORRHAGE 05/27/2023 Kokabi, Nima, MD IMG VIR [facility]  . IR EMBOLIZATION ORGAN ISCHEMIA, TUMORS, INFAR  05/27/2023   IR EMBOLIZATION ORGAN ISCHEMIA, TUMORS, INFAR 05/27/2023 Stasia Pencil, MD IMG VIR [facility]  . PR CATH PLACE/CORON ANGIO, IMG SUPER/INTERP,R&L HRT CATH, L HRT VENTRIC N/A 03/02/2022   Procedure: Left/Right Heart Catheterization;  Surgeon: Zachary Prentice Car, MD;  Location: [facility] CATH;  Service: Cardiology  . PR COLONOSCOPY W/BIOPSY SINGLE/MULTIPLE Left 06/01/2017   Procedure: COLONOSCOPY, FLEXIBLE, PROXIMAL TO SPLENIC FLEXURE; WITH BIOPSY, SINGLE OR MULTIPLE;  Surgeon: Arlean Obadiah Spitz, MD;  Location: [facility] GI PROCEDURES [facility];  Service: Gastroenterology  . PR COLSC FLX W/RMVL OF TUMOR POLYP LESION SNARE TQ  07/27/2013   Procedure: COLONOSCOPY FLEX; W/REMOV TUMOR/LES BY SNARE;  Surgeon: Olam CHRISTELLA Henle, MD;  [facility];  Service: Gastroenterology  . PR COMPRE EP EVAL ABLTJ 3D MAPG TX SVT N/A 12/07/2022   Procedure: A-Flutter Ablation;  Surgeon: Von Shawl, MD;  Location: [facility] EP;  Service: Cardiology  . PR CRANIECT EXPL/DECOM CRANIAL NER Right 08/03/2023   Procedure: MVD for Trigeminal Neuralgia;  Surgeon: Alm Eric Cove, MD;  Location: OR [facility];  Service: Neurosurgery  . PR CYSTO/URETERO W/LITHOTRIPSY  &INDWELL STENT INSRT Left 05/23/2014   Procedure: CYSTOURETHROSCOPY, WITH URETEROSCOPY AND/OR PYELOSCOPY; WITH LITHOTRIPSY INCLUDING INSERTION OF INDWELLING URETERAL STENT;  Surgeon: Nicholaus SQUIBB Viprakasit, MD;  Location: CYSTO PROCEDURE SUITES [facility];  Service: Urology  . PR CYSTOURETHROSCOPY,URETER CATHETER Left 05/23/2014   Procedure: CYSTOURETHROSCOPY, W/URETERAL CATHETERIZATION, W/WO IRRIG, INSTILL, OR URETEROPYELOG, EXCLUS OF RADIOLG SVC;  Surgeon: Nicholaus SQUIBB Viprakasit, MD;  Location: CYSTO PROCEDURE SUITES [facility];  Service: Urology  . PR INSJ/RPLCMT PERM DFB W/TRNSVNS LDS 1/DUAL CHMBR N/A 12/07/2022   Procedure: ICD Implant System (Single/Dual);  Surgeon: Von Shawl, MD;  Location: [facility] EP;  Service: Cardiology  . PR TCAT IMPL WRLS P-ART PRS SNR L-T HEMODYN MNTR N/A 11/04/2023   Procedure: Cardiomems w RHC;  Surgeon: Drusilla Elsie Lupita DOUGLAS, MD;  Location: [facility] CATH;  Service: Cardiology

## 2024-06-06 ENCOUNTER — Observation Stay
Admission: EM | Admit: 2024-06-06 | Discharge: 2024-06-07 | Disposition: A | Discharge status: Home Health | Attending: Obstetrics and Gynecology | Admitting: Obstetrics and Gynecology

## 2024-06-06 ENCOUNTER — Encounter: Payer: Self-pay | Admitting: Emergency Medicine

## 2024-06-06 ENCOUNTER — Emergency Department

## 2024-06-06 DIAGNOSIS — I69354 Hemiplegia and hemiparesis following cerebral infarction affecting left non-dominant side: Secondary | ICD-10-CM | POA: Diagnosis not present

## 2024-06-06 DIAGNOSIS — I4819 Other persistent atrial fibrillation: Secondary | ICD-10-CM | POA: Diagnosis not present

## 2024-06-06 DIAGNOSIS — Z95810 Presence of automatic (implantable) cardiac defibrillator: Secondary | ICD-10-CM | POA: Diagnosis not present

## 2024-06-06 DIAGNOSIS — D472 Monoclonal gammopathy: Secondary | ICD-10-CM | POA: Diagnosis not present

## 2024-06-06 DIAGNOSIS — Z79899 Other long term (current) drug therapy: Secondary | ICD-10-CM | POA: Insufficient documentation

## 2024-06-06 DIAGNOSIS — R42 Dizziness and giddiness: Secondary | ICD-10-CM | POA: Diagnosis present

## 2024-06-06 DIAGNOSIS — R079 Chest pain, unspecified: Secondary | ICD-10-CM

## 2024-06-06 DIAGNOSIS — I130 Hypertensive heart and chronic kidney disease with heart failure and stage 1 through stage 4 chronic kidney disease, or unspecified chronic kidney disease: Secondary | ICD-10-CM | POA: Diagnosis not present

## 2024-06-06 DIAGNOSIS — Z7901 Long term (current) use of anticoagulants: Secondary | ICD-10-CM | POA: Diagnosis not present

## 2024-06-06 DIAGNOSIS — Z9889 Other specified postprocedural states: Secondary | ICD-10-CM | POA: Diagnosis not present

## 2024-06-06 DIAGNOSIS — I2510 Atherosclerotic heart disease of native coronary artery without angina pectoris: Secondary | ICD-10-CM | POA: Insufficient documentation

## 2024-06-06 DIAGNOSIS — Z85528 Personal history of other malignant neoplasm of kidney: Secondary | ICD-10-CM | POA: Diagnosis not present

## 2024-06-06 DIAGNOSIS — G4733 Obstructive sleep apnea (adult) (pediatric): Secondary | ICD-10-CM | POA: Insufficient documentation

## 2024-06-06 DIAGNOSIS — I5022 Chronic systolic (congestive) heart failure: Secondary | ICD-10-CM | POA: Diagnosis not present

## 2024-06-06 DIAGNOSIS — Z95818 Presence of other cardiac implants and grafts: Secondary | ICD-10-CM | POA: Insufficient documentation

## 2024-06-06 DIAGNOSIS — N1832 Chronic kidney disease, stage 3b: Secondary | ICD-10-CM | POA: Insufficient documentation

## 2024-06-06 DIAGNOSIS — G40909 Epilepsy, unspecified, not intractable, without status epilepticus: Secondary | ICD-10-CM | POA: Diagnosis not present

## 2024-06-06 DIAGNOSIS — Z8679 Personal history of other diseases of the circulatory system: Secondary | ICD-10-CM | POA: Insufficient documentation

## 2024-06-06 DIAGNOSIS — E1122 Type 2 diabetes mellitus with diabetic chronic kidney disease: Secondary | ICD-10-CM | POA: Insufficient documentation

## 2024-06-06 DIAGNOSIS — I10 Essential (primary) hypertension: Secondary | ICD-10-CM | POA: Diagnosis present

## 2024-06-06 DIAGNOSIS — I951 Orthostatic hypotension: Secondary | ICD-10-CM

## 2024-06-06 DIAGNOSIS — G500 Trigeminal neuralgia: Secondary | ICD-10-CM | POA: Diagnosis present

## 2024-06-06 DIAGNOSIS — E119 Type 2 diabetes mellitus without complications: Secondary | ICD-10-CM

## 2024-06-06 HISTORY — DX: Heart failure, unspecified: I50.9

## 2024-06-06 HISTORY — DX: Malignant neoplasm of unspecified kidney, except renal pelvis: C64.9

## 2024-06-06 HISTORY — DX: Cerebral infarction, unspecified: I63.9

## 2024-06-06 LAB — URINALYSIS, ROUTINE W REFLEX MICROSCOPIC
Bilirubin Urine: NEGATIVE
Glucose, UA: NEGATIVE mg/dL
Hgb urine dipstick: NEGATIVE
Ketones, ur: NEGATIVE mg/dL
Leukocytes,Ua: NEGATIVE
Nitrite: NEGATIVE
Protein, ur: NEGATIVE mg/dL
Specific Gravity, Urine: 1.008 (ref 1.005–1.030)
pH: 5 (ref 5.0–8.0)

## 2024-06-06 LAB — BASIC METABOLIC PANEL WITH GFR
Anion gap: 12 (ref 5–15)
BUN: 46 mg/dL — ABNORMAL HIGH (ref 8–23)
CO2: 25 mmol/L (ref 22–32)
Calcium: 8.8 mg/dL — ABNORMAL LOW (ref 8.9–10.3)
Chloride: 103 mmol/L (ref 98–111)
Creatinine, Ser: 2.08 mg/dL — ABNORMAL HIGH (ref 0.61–1.24)
GFR, Estimated: 35 mL/min — ABNORMAL LOW (ref >60)
Glucose, Bld: 136 mg/dL — ABNORMAL HIGH (ref 70–99)
Potassium: 4.1 mmol/L (ref 3.5–5.1)
Sodium: 140 mmol/L (ref 135–145)

## 2024-06-06 LAB — CBC
HCT: 37.6 % — ABNORMAL LOW (ref 39.0–52.0)
Hemoglobin: 12.2 g/dL — ABNORMAL LOW (ref 13.0–17.0)
MCH: 29.2 pg (ref 26.0–34.0)
MCHC: 32.4 g/dL (ref 30.0–36.0)
MCV: 90 fL (ref 80.0–100.0)
Platelets: 174 K/uL (ref 150–400)
RBC: 4.18 MIL/uL — ABNORMAL LOW (ref 4.22–5.81)
RDW: 12.9 % (ref 11.5–15.5)
WBC: 9.2 K/uL (ref 4.0–10.5)
nRBC: 0 % (ref 0.0–0.2)

## 2024-06-06 LAB — TROPONIN I (HIGH SENSITIVITY)
Troponin I (High Sensitivity): 51 ng/L — ABNORMAL HIGH (ref <18)
Troponin I (High Sensitivity): 21 ng/L — ABNORMAL HIGH (ref <18)

## 2024-06-06 LAB — BRAIN NATRIURETIC PEPTIDE: B Natriuretic Peptide: 459.1 pg/mL — ABNORMAL HIGH (ref 0.0–100.0)

## 2024-06-06 NOTE — ED Provider Notes (Signed)
 [facility] Provider Note    Event Date/Time   First MD Initiated Contact with Patient 06/06/24 1950     (approximate)   History   Dizziness   HPI Rajesh Wyss is a 63 y.o. male with history of HFrEF, ICD, DM2, HTN, CKD stage III, prior CVA with left-sided hemiparesis, A-fib on Eliquis  presenting today for lightheadedness.  Patient presents with family after they report starting yesterday he had 1 episode of pain along his right shoulder and right neck.  He has reported intermittent episodes of lightheadedness including 1 near syncope.  Today he reportedly woke up with a onset of sharp pain along the right side of his chest which quickly dissipated.  Ongoing lightheadedness.  1 episode of nausea.  Also described hearing loss which is new.  Family felt he was slightly more confused today as well than his baseline.  He denies any new numbness or weakness to any of his extremities.  He currently denies any chest pain, shortness of breath, nausea, vomiting, abdominal pain, dysuria.     Physical Exam   Triage Vital Signs: ED Triage Vitals [06/06/24 1939]  Encounter Vitals Group     BP (!) 146/92     Girls Systolic BP Percentile      Girls Diastolic BP Percentile      Boys Systolic BP Percentile      Boys Diastolic BP Percentile      Pulse Rate (!) 58     Resp 18     Temp 98.7 F (37.1 C)     Temp Source Oral     SpO2 98 %     Weight      Height      Head Circumference      Peak Flow      Pain Score      Pain Loc      Pain Education      Exclude from Growth Chart     Most recent vital signs: Vitals:   06/06/24 1939 06/06/24 2300  BP: (!) 146/92 124/89  Pulse: (!) 58 (!) 110  Resp: 18 12  Temp: 98.7 F (37.1 C)   SpO2: 98% 98%   I have reviewed the vital signs. General:  Awake, alert, no acute distress. Head:  Normocephalic, Atraumatic. EENT:  PERRL, EOMI, Oral mucosa pink and moist, Neck is supple.  Indicates diminished hearing in his  right ear with comparison to the left but both diminished from baseline.  No cerumen impaction or evidence of other inner ear abnormalities Cardiovascular: Regular rate, 2+ distal pulses. Respiratory:  Normal respiratory effort, symmetrical expansion, no distress. Abdomen: Soft, nontender, nondistended Extremities: Moving right-sided extremities without any issue.  Left-sided extremities with mild hemiparesis but compared to his baseline is stable. Neuro:  Alert and oriented.  Interacting appropriately.  No obvious confusion noted on my exam and fully alert and oriented.  Speech at baseline with no slurring or aphasia.  Left-sided mild hemiparesis to extremities present but not different from his baseline.  Strength and sensation intact throughout entire right sided extremities at his baseline.  No new focal neurological deficits outside of hearing diminished in right ear Skin:  Warm, dry, no rash.   Psych: Appropriate affect.    ED Results / Procedures / Treatments   Labs (all labs ordered are listed, but only abnormal results are displayed) Labs Reviewed  CBC - Abnormal; Notable for the following components:      Result Value   RBC  4.18 (*)    Hemoglobin 12.2 (*)    HCT 37.6 (*)    All other components within normal limits  URINALYSIS, ROUTINE W REFLEX MICROSCOPIC - Abnormal; Notable for the following components:   Color, Urine COLORLESS (*)    APPearance CLEAR (*)    All other components within normal limits  BASIC METABOLIC PANEL WITH GFR - Abnormal; Notable for the following components:   Glucose, Bld 136 (*)    BUN 46 (*)    Creatinine, Ser 2.08 (*)    Calcium 8.8 (*)    GFR, Estimated 35 (*)    All other components within normal limits  BRAIN NATRIURETIC PEPTIDE - Abnormal; Notable for the following components:   B Natriuretic Peptide 459.1 (*)    All other components within normal limits  TROPONIN I (HIGH SENSITIVITY) - Abnormal; Notable for the following components:    Troponin I (High Sensitivity) 51 (*)    All other components within normal limits  TROPONIN I (HIGH SENSITIVITY) - Abnormal; Notable for the following components:   Troponin I (High Sensitivity) 21 (*)    All other components within normal limits     EKG My EKG interpretation: Rate of 71, AV dual paced rhythm.  No acute ST elevations or depressions   RADIOLOGY Independently interpreted CT head with chronic encephalomalacia but no acute abnormalities.  Chest x-ray with no acute findings   PROCEDURES:  Critical Care performed: No  Procedures   MEDICATIONS ORDERED IN ED: Medications - No data to display   IMPRESSION / MDM / ASSESSMENT AND PLAN / ED COURSE  I reviewed the triage vital signs and the nursing notes.                              Differential diagnosis includes, but is not limited to, CVA, hypotension, cardiac arrhythmia, electrolyte abnormality, dehydration, ACS, heart failure  Patient's presentation is most consistent with acute complicated illness / injury requiring diagnostic workup.  Patient is a 63 year old male presenting today for multiple complaints.  Most concerning seems to be dizziness and unsteadiness when up walking like he is going to fall which has been going on for 36 hours.  Separately having some mild right sided hearing loss in comparison to his baseline.  No other acute neurological deficits.  Will also workup 1 episode of right-sided chest pain that is no longer present at this time.  Outside of his current dizziness and hearing loss, he is otherwise asymptomatic.  Vital signs are stable.  EKG with no ischemic findings.  CBC and BMP largely comparable to his baseline.  UA negative.  BNP at 495.  Chest x-ray and CT head showed no acute pathology.  Troponin went from 51-21 although no ongoing chest pain at this time given concerns of prior CVA and continued dizziness and unsteadiness on his feet concerning for strokelike pathology, patient will need MRI  for further evaluation.  MRI department can do this in the morning.  Signed out to oncoming provider pending admission to hospitalist for ongoing workup and evaluation.  The patient is on the cardiac monitor to evaluate for evidence of arrhythmia and/or significant heart rate changes.     FINAL CLINICAL IMPRESSION(S) / ED DIAGNOSES   Final diagnoses:  Dizziness  Chest pain, unspecified type     Rx / DC Orders   ED Discharge Orders     None        Note:  This document was prepared using Dragon voice recognition software and may include unintentional dictation errors.   Malvina Alm DASEN, MD 06/06/24 [phone]

## 2024-06-06 NOTE — ED Triage Notes (Signed)
 Pt arrived via [facility] from home where pt woke with sudden inability to hear, dizziness and nausea. Pt has pace maker/difib, recent brain surgery. Previous CVA affecting his left side. Per family, pt has c/o right shoulder pain that radiates into his neck and jaw x3 days. Hx/o CVA, CHF, AFib.      **Spoke with EDP to receive orders

## 2024-06-07 ENCOUNTER — Other Ambulatory Visit: Payer: Self-pay

## 2024-06-07 DIAGNOSIS — G4733 Obstructive sleep apnea (adult) (pediatric): Secondary | ICD-10-CM | POA: Insufficient documentation

## 2024-06-07 DIAGNOSIS — E119 Type 2 diabetes mellitus without complications: Secondary | ICD-10-CM

## 2024-06-07 DIAGNOSIS — Z85528 Personal history of other malignant neoplasm of kidney: Secondary | ICD-10-CM

## 2024-06-07 DIAGNOSIS — R42 Dizziness and giddiness: Secondary | ICD-10-CM | POA: Diagnosis not present

## 2024-06-07 DIAGNOSIS — I951 Orthostatic hypotension: Secondary | ICD-10-CM

## 2024-06-07 DIAGNOSIS — Z95810 Presence of automatic (implantable) cardiac defibrillator: Secondary | ICD-10-CM

## 2024-06-07 DIAGNOSIS — I69354 Hemiplegia and hemiparesis following cerebral infarction affecting left non-dominant side: Secondary | ICD-10-CM

## 2024-06-07 DIAGNOSIS — G40909 Epilepsy, unspecified, not intractable, without status epilepticus: Secondary | ICD-10-CM

## 2024-06-07 DIAGNOSIS — I5022 Chronic systolic (congestive) heart failure: Secondary | ICD-10-CM

## 2024-06-07 DIAGNOSIS — D472 Monoclonal gammopathy: Secondary | ICD-10-CM | POA: Insufficient documentation

## 2024-06-07 LAB — CBG MONITORING, ED: Glucose-Capillary: 130 mg/dL — ABNORMAL HIGH (ref 70–99)

## 2024-06-07 LAB — HEMOGLOBIN A1C
Hgb A1c MFr Bld: 8 % — ABNORMAL HIGH (ref 4.8–5.6)
Mean Plasma Glucose: 183 mg/dL

## 2024-06-07 LAB — LIPID PANEL
Cholesterol: 181 mg/dL (ref 0–200)
HDL: 41 mg/dL (ref >40)
LDL Cholesterol: 98 mg/dL (ref 0–99)
Total CHOL/HDL Ratio: 4.4 ratio
Triglycerides: 212 mg/dL — ABNORMAL HIGH (ref <150)
VLDL: 42 mg/dL — ABNORMAL HIGH (ref 0–40)

## 2024-06-07 LAB — HIV ANTIBODY (ROUTINE TESTING W REFLEX): HIV Screen 4th Generation wRfx: NONREACTIVE

## 2024-06-07 MED ORDER — ONDANSETRON 4 MG PO TBDP
4.0000 mg | ORAL_TABLET | Freq: Four times a day (QID) | ORAL | Status: DC | PRN
  Administered 2024-06-07: 4 mg via ORAL
  Filled 2024-06-07: qty 1

## 2024-06-07 MED ORDER — OXCARBAZEPINE 300 MG PO TABS
300.0000 mg | ORAL_TABLET | Freq: Two times a day (BID) | ORAL | Status: DC
  Filled 2024-06-07: qty 1

## 2024-06-07 MED ORDER — STROKE: EARLY STAGES OF RECOVERY BOOK: Freq: Once | Status: DC

## 2024-06-07 MED ORDER — ACETAMINOPHEN 160 MG/5ML PO SOLN: 650.0000 mg | ORAL | Status: DC | PRN

## 2024-06-07 MED ORDER — ISOSORBIDE DINITRATE 30 MG PO TABS
30.0000 mg | ORAL_TABLET | Freq: Three times a day (TID) | ORAL | Status: DC
  Filled 2024-06-07 (×2): qty 1

## 2024-06-07 MED ORDER — ACETAMINOPHEN 650 MG RE SUPP: 650.0000 mg | RECTAL | Status: DC | PRN

## 2024-06-07 MED ORDER — METOPROLOL SUCCINATE ER 50 MG PO TB24: 200.0000 mg | ORAL_TABLET | Freq: Every day | ORAL | Status: DC

## 2024-06-07 MED ORDER — PHENYTOIN SODIUM EXTENDED 100 MG PO CAPS: 200.0000 mg | ORAL_CAPSULE | Freq: Every day | ORAL | Status: DC

## 2024-06-07 MED ORDER — INSULIN ASPART 100 UNIT/ML IJ SOLN
0.0000 [IU] | INTRAMUSCULAR | Status: DC
  Filled 2024-06-07: qty 1

## 2024-06-07 MED ORDER — SPIRONOLACTONE 25 MG PO TABS: 50.0000 mg | ORAL_TABLET | Freq: Every day | ORAL | Status: DC

## 2024-06-07 MED ORDER — APIXABAN 5 MG PO TABS: 5.0000 mg | ORAL_TABLET | Freq: Two times a day (BID) | ORAL | Status: DC

## 2024-06-07 MED ORDER — MECLIZINE HCL 25 MG PO TABS: 12.5000 mg | ORAL_TABLET | Freq: Three times a day (TID) | ORAL | Status: DC | PRN

## 2024-06-07 MED ORDER — SPIRONOLACTONE 25 MG PO TABS: 75.0000 mg | ORAL_TABLET | Freq: Every day | ORAL | Status: DC

## 2024-06-07 MED ORDER — ACETAMINOPHEN 325 MG PO TABS: 650.0000 mg | ORAL_TABLET | ORAL | Status: DC | PRN

## 2024-06-07 MED ORDER — ENOXAPARIN SODIUM 60 MG/0.6ML IJ SOSY
50.0000 mg | PREFILLED_SYRINGE | INTRAMUSCULAR | Status: DC
  Administered 2024-06-07: 50 mg via SUBCUTANEOUS
  Filled 2024-06-07: qty 0.6

## 2024-06-07 MED ORDER — BUMETANIDE 1 MG PO TABS
1.0000 mg | ORAL_TABLET | Freq: Every day | ORAL | Status: DC
Start: 2024-06-07 — End: 2024-06-07
  Filled 2024-06-07: qty 1

## 2024-06-07 MED ORDER — ORAL CARE MOUTH RINSE: 15.0000 mL | OROMUCOSAL | Status: DC | PRN

## 2024-06-07 NOTE — Evaluation (Signed)
 Occupational Therapy Evaluation Patient Details Name: Cameron Klein MRN: [id number] DOB: 11-Nov-1961 Today's Date: 06/07/2024   History of Present Illness   Pt is a 63 y.o. male being admitted for workup of dizziness x 24 hours, initial CT head nonacute.  PMH of Nonobstructive CAD, HFrEF(EF 30-35%) s/p AICD, s/p CardioMEMS, CVA with residual left-sided hemiparesis, A-fib on Eliquis , trigeminal neuralgia s/p MVD on prophylactic seizure meds, HTN, HLD, DM, CKD 3b,, RCC s/p bilateral cryoablation 2017 (on surveillance MRI), MGUS, untreated OSA     Clinical Impressions Pt was seen for OT evaluation this date. PTA, pt resides in a one level home with his wife with 1 STE. Reports he is a household ambulator without AD use, does utilize a quad cane on unlevel surfaces. He has Mod A for UB ADLs from his wife, typically manages toileting on his own. Denies falls.  Pt presents to acute OT demonstrating impaired ADL performance and functional mobility 2/2 dizziness causing mild imbalance. Pt currently requires supervision for bed mobility and CGA via HHA x1 for STS from EOB and ambulation into the hallway and back ~70 ft. He needed total assist for LB dressing to don bils socks-baseline. Pt reports mild dizziness with all OOB activity as well as stiffness to bil shoulders and neck. RUE ROM WFL and strength intact. Pt would benefit from skilled OT services to address noted impairments and functional limitations to maximize safety and independence while minimizing falls risk and caregiver burden. Do anticipate the need for follow up OT services upon acute hospital DC.      If plan is discharge home, recommend the following:   A little help with walking and/or transfers;A little help with bathing/dressing/bathroom;Help with stairs or ramp for entrance;Assistance with cooking/housework;Assist for transportation     Functional Status Assessment   Patient has had a recent decline in their functional status  and demonstrates the ability to make significant improvements in function in a reasonable and predictable amount of time.     Equipment Recommendations   None recommended by OT (has needed equipment)     Recommendations for Other Services         Precautions/Restrictions   Precautions Precautions: Fall Recall of Precautions/Restrictions: Intact Restrictions Weight Bearing Restrictions Per Provider Order: No     Mobility Bed Mobility Overal bed mobility: Modified Independent             General bed mobility comments: supervision/increased time and effort using bed rails    Transfers Overall transfer level: Needs assistance Equipment used: 1 person hand held assist Transfers: Sit to/from Stand Sit to Stand: Contact guard assist           General transfer comment: CGA for transfers and ambulation via HHA x1-reports being near his baseline other than the dizziness      Balance Overall balance assessment: Needs assistance Sitting-balance support: Feet supported Sitting balance-Leahy Scale: Good     Standing balance support: Single extremity supported, During functional activity Standing balance-Leahy Scale: Fair Standing balance comment: CGA via HHA                           ADL either performed or assessed with clinical judgement   ADL Overall ADL's : Needs assistance/impaired                     Lower Body Dressing: Total assistance Lower Body Dressing Details (indicate cue type and reason): to don bil socks at  EOB             Functional mobility during ADLs: Contact guard assist General ADL Comments: CGA via HHA for in room mobility, has assists with ADLs at baseline d/t L sided deficits from previous CVA     Vision         Perception         Praxis         Pertinent Vitals/Pain Pain Assessment Pain Assessment: 0-10 Pain Location: neck and bil shoulder stiffness Pain Descriptors / Indicators: Tightness Pain  Intervention(s): Monitored during session     Extremity/Trunk Assessment Upper Extremity Assessment Upper Extremity Assessment: Right hand dominant;LUE deficits/detail LUE Deficits / Details: baseline L sided hemiplegia from previous CVA   Lower Extremity Assessment Lower Extremity Assessment: Overall WFL for tasks assessed;LLE deficits/detail LLE Deficits / Details: previous CVA   Cervical / Trunk Assessment Cervical / Trunk Assessment: Normal   Communication Communication Communication: No apparent difficulties   Cognition Arousal: Alert Behavior During Therapy: WFL for tasks assessed/performed                                 Following commands: Intact       Cueing  General Comments   Cueing Techniques: Verbal cues  dizziness with all OOB activity   Exercises Other Exercises Other Exercises: Edu on role of OT in acute setting.   Shoulder Instructions      Home Living Family/patient expects to be discharged to:: Private residence Living Arrangements: Spouse/significant other Available Help at Discharge: Family;Available 24 hours/day Type of Home: House Home Access: Stairs to enter [employer] of Steps: 1 step through garage Entrance Stairs-Rails: None Home Layout: One level     Bathroom Shower/Tub: Chief Strategy Officer: Standard     Home Equipment: Cane - single point;Wheelchair - manual;Cane - quad;Shower seat          Prior Functioning/Environment Prior Level of Function : Needs assist       Physical Assist : Mobility (physical);ADLs (physical)     Mobility Comments: IND household ambulator, uses a quad cane when outside/unlevel surfaces; denies falls in last 6 months; rides motorized cart in grocery store ADLs Comments: wife assists with showering and dressing-UB  ADLs, pt able to manage toileting on his own; typically stands to shower    OT Problem List: Impaired balance (sitting and/or standing)   OT  Treatment/Interventions: Self-care/ADL training;Therapeutic exercise;Therapeutic activities;Patient/family education;DME and/or AE instruction;Balance training      OT Goals(Current goals can be found in the care plan section)   Acute Rehab OT Goals Patient Stated Goal: return home OT Goal Formulation: With patient Time For Goal Achievement: 06/21/24 Potential to Achieve Goals: Good ADL Goals Pt Will Perform Lower Body Bathing: sitting/lateral leans;sit to/from stand;with min assist Pt Will Transfer to Toilet: with supervision;ambulating;regular height toilet Pt Will Perform Toileting - Clothing Manipulation and hygiene: with modified independence;sitting/lateral leans;sit to/from stand   OT Frequency:  Min 2X/week    Co-evaluation PT/OT/SLP Co-Evaluation/Treatment: Yes Reason for Co-Treatment: Necessary to address cognition/behavior during functional activity;To address functional/ADL transfers;For patient/therapist safety PT goals addressed during session: Mobility/safety with mobility;Balance OT goals addressed during session: ADL's and self-care      AM-PAC OT 6 Clicks Daily Activity     Outcome Measure Help from another person eating meals?: None Help from another person taking care of personal grooming?: None Help from another person toileting,  which includes using toliet, bedpan, or urinal?: A Little Help from another person bathing (including washing, rinsing, drying)?: A Little Help from another person to put on and taking off regular upper body clothing?: A Lot Help from another person to put on and taking off regular lower body clothing?: A Lot 6 Click Score: 18   End of Session Equipment Utilized During Treatment: Gait belt Nurse Communication: Mobility status  Activity Tolerance: Patient tolerated treatment well Patient left: in bed;with call bell/phone within reach;with bed alarm set;with family/visitor present  OT Visit Diagnosis: Other abnormalities of gait  and mobility (R26.89);Unsteadiness on feet (R26.81)                Time: 0912-0930 OT Time Calculation (min): 18 min Charges:  OT General Charges $OT Visit: 1 Visit OT Evaluation $OT Eval Low Complexity: 1 Low Krissy Orebaugh, OTR/L  06/07/24, 11:07 AM  Judy Pollman E Julita Ozbun 06/07/2024, 11:04 AM

## 2024-06-07 NOTE — Progress Notes (Signed)
 Notified by CCMD at 0950 that at 0810, patient had a 32-best run of wide QRS following by bigeminy but is now NSR/paced with PVCs. Dr. Kandis made aware.

## 2024-06-07 NOTE — Assessment & Plan Note (Signed)
 History of ablation Continue Eliquis 

## 2024-06-07 NOTE — Assessment & Plan Note (Signed)
 Continue Eliquis and statin.

## 2024-06-07 NOTE — TOC Transition Note (Signed)
 Transition of Care [facility]) - Discharge Note   Patient Details  Name: Cameron Klein MRN: [id number] Date of Birth: January 12, 1961  Transition of Care [facility]) CM/SW Contact:  Elouise LULLA Capri, RN Phone Number: 06/07/2024, 12:22 PM   Clinical Narrative:      Discharge orders noted. Patient discharged to home/self care. Patient wife, [name] declined home health recommendations. CM secure message to [place] , [facility] to cancel home health referral Final next level of care: Home w Home Health Services Barriers to Discharge: No Barriers Identified   Patient Goals and CMS Choice    Home/self care Discharge Placement          Home with home health/[facility]--declined per patient wife,    Discharge Plan and Services Additional resources added to the After Visit Summary for       HH Arranged: PT, OT HH Agency: [facility] Date [facility] Agency Contacted: 06/07/24 Time HH Agency Contacted: 1222 Representative spoke with at [facility] Agency: [place]   Social Drivers of Health (SDOH) Interventions SDOH Screenings   Food Insecurity: No Food Insecurity (06/07/2024)  Housing: Low Risk  (06/07/2024)  Transportation Needs: No Transportation Needs (06/07/2024)  Utilities: Not At Risk (06/07/2024)  Financial Resource Strain: Low Risk  (08/05/2023)   Received from [facility]  Physical Activity: Inactive (10/29/2020)   Received from [facility]  Social Connections: Moderately Integrated (06/07/2024)  Tobacco Use: Low Risk  (06/06/2024)  Recent Concern: Tobacco Use - Medium Risk (05/04/2024)   Received from [facility]     Readmission Risk Interventions     No data to display

## 2024-06-07 NOTE — Assessment & Plan Note (Signed)
 On surveillance MRI No acute issues

## 2024-06-07 NOTE — Assessment & Plan Note (Signed)
 No complaints of chest pain Continue home meds

## 2024-06-07 NOTE — Assessment & Plan Note (Addendum)
 S/p AICD S/p CardioMEMS Clinically euvolemic Continue GDMT-to resume Entresto, Bumex , metoprolol , spironolactone  and hydralazine, once stroke ruled out Followed by [facility] cardiology, last seen April 2025

## 2024-06-07 NOTE — Progress Notes (Signed)
 SLP Cancellation Note  Patient Details Name: Ruben Mahler MRN: [id number] DOB: 1961-02-11   Cancelled treatment:       Reason Eval/Treat Not Completed: SLP screened, no needs identified, will sign off (chart reviewed; consulted NSG and met w/ pt/Wife in room.)  Pt is a 63 y.o. male with medical history significant for Nonobstructive CAD, HFrEF(EF 30-35%) s/p AICD, s/p CardioMEMS, CVA with residual left-sided hemiparesis, A-fib on Eliquis , trigeminal neuralgia s/p MVD on prophylactic seizure meds, HTN, HLD, DM, CKD 3b,, RCC s/p bilateral cryoablation 2017 (on surveillance MRI), MGUS, untreated OSA, being admitted for workup of dizziness x 24 hours, initial CT head nonacute.   Pt denied any difficulty swallowing and is currently on a regular diet; tolerates swallowing pills w/ APPLESAUCE- BASELINE per pt/Wife. Pt conversed in conversation w/out expressive/receptive deficits noted; he made all wants/needs known appropriately. Pt denied any speech-language deficits. Speech clear. No further skilled ST services indicated as pt appears at his baseline. Pt agreed. NSG to reconsult if any change in status while admitted.        Comer Portugal, MS, CCC-SLP Speech Language Pathologist Rehab Services; [facility] [phone] (ascom) Lakayla Barrington 06/07/2024, 10:26 AM

## 2024-06-07 NOTE — Assessment & Plan Note (Signed)
 Sliding scale insulin coverage

## 2024-06-07 NOTE — Evaluation (Signed)
 Physical Therapy Evaluation Patient Details Name: Cameron Klein MRN: [id number] DOB: 26-Apr-1961 Today's Date: 06/07/2024  History of Present Illness  Cameron Klein is a 63 y.o. male with medical history significant for Nonobstructive CAD, HFrEF(EF 30-35%) s/p AICD, s/p CardioMEMS, CVA with residual left-sided hemiparesis, A-fib on Eliquis , trigeminal neuralgia s/p MVD on prophylactic seizure meds, HTN, HLD, DM, CKD 3b,, RCC s/p bilateral cryoablation 2017 (on surveillance MRI), MGUS, untreated OSA, being admitted for workup of dizziness x 24 hours, initial CT head nonacute   Clinical Impression  Patient received in bed, wife at bedside. He is agreeable to PT/OT assessment. Patient is mod I for bed mobility with use of bed rail and increased time/effort. He is able to stand with cga and ambulated with single UE support (hand held) 70 feet. Patient limited by dizziness. He will continue to benefit from skilled PT to improve mobility and independence.         If plan is discharge home, recommend the following: A little help with walking and/or transfers;A little help with bathing/dressing/bathroom;Assist for transportation;Help with stairs or ramp for entrance   Can travel by private vehicle    yes    Equipment Recommendations None recommended by PT  Recommendations for Other Services       Functional Status Assessment Patient has had a recent decline in their functional status and demonstrates the ability to make significant improvements in function in a reasonable and predictable amount of time.     Precautions / Restrictions Precautions Precautions: Fall Recall of Precautions/Restrictions: Intact Restrictions Weight Bearing Restrictions Per Provider Order: No      Mobility  Bed Mobility Overal bed mobility: Modified Independent             General bed mobility comments: increased time/effort, use of bed rails    Transfers Overall transfer level: Needs  assistance Equipment used: 1 person hand held assist Transfers: Sit to/from Stand Sit to Stand: Contact guard assist                Ambulation/Gait Ambulation/Gait assistance: Contact guard assist Gait Distance (Feet): 70 Feet Assistive device: 1 person hand held assist Gait Pattern/deviations: Step-through pattern Gait velocity: decr     General Gait Details: patient appears to be close to baseline mobility, however reports dizziness throughout.  Stairs            Wheelchair Mobility     Tilt Bed    Modified Rankin (Stroke Patients Only)       Balance Overall balance assessment: Needs assistance Sitting-balance support: Feet supported Sitting balance-Leahy Scale: Good     Standing balance support: Single extremity supported, During functional activity Standing balance-Leahy Scale: Fair                               Pertinent Vitals/Pain Pain Assessment Pain Location: neck stiffness Pain Descriptors / Indicators: Tightness Pain Intervention(s): Monitored during session, Repositioned    Home Living Family/patient expects to be discharged to:: Private residence Living Arrangements: Spouse/significant other Available Help at Discharge: Family;Available 24 hours/day Type of Home: House Home Access: Stairs to enter Entrance Stairs-Rails: None Entrance Stairs-Number of Steps: 1 step through garage   Home Layout: One level Home Equipment: Cane - single point;Wheelchair - manual;Cane - quad;Shower seat      Prior Function Prior Level of Function : Needs assist       Physical Assist : Mobility (physical);ADLs (physical)  Mobility Comments: IND household ambulator, uses a quad cane when outside/unlevel surfaces; denies falls in last 6 months; rides motorized cart in grocery store ADLs Comments: wife assists with showering and dressing-UB  ADLs, pt able to manage toileting on his own; typically stands to shower     Extremity/Trunk  Assessment   Upper Extremity Assessment Upper Extremity Assessment: Defer to OT evaluation    Lower Extremity Assessment Lower Extremity Assessment: Overall WFL for tasks assessed    Cervical / Trunk Assessment Cervical / Trunk Assessment: Normal  Communication   Communication Communication: No apparent difficulties    Cognition Arousal: Alert Behavior During Therapy: WFL for tasks assessed/performed   PT - Cognitive impairments: No apparent impairments                         Following commands: Intact       Cueing Cueing Techniques: Verbal cues     General Comments      Exercises     Assessment/Plan    PT Assessment Patient needs continued PT services  PT Problem List Decreased mobility;Decreased activity tolerance       PT Treatment Interventions Gait training;Functional mobility training;Therapeutic activities;Therapeutic exercise;Stair training;Patient/family education    PT Goals (Current goals can be found in the Care Plan section)  Acute Rehab PT Goals Patient Stated Goal: improve dizziness PT Goal Formulation: With patient Time For Goal Achievement: 06/21/24 Potential to Achieve Goals: Good    Frequency Min 2X/week     Co-evaluation PT/OT/SLP Co-Evaluation/Treatment: Yes Reason for Co-Treatment: Necessary to address cognition/behavior during functional activity;To address functional/ADL transfers;For patient/therapist safety PT goals addressed during session: Mobility/safety with mobility;Balance         AM-PAC PT 6 Clicks Mobility  Outcome Measure Help needed turning from your back to your side while in a flat bed without using bedrails?: A Little Help needed moving from lying on your back to sitting on the side of a flat bed without using bedrails?: A Little Help needed moving to and from a bed to a chair (including a wheelchair)?: A Little Help needed standing up from a chair using your arms (e.g., wheelchair or bedside  chair)?: A Little Help needed to walk in hospital room?: A Little Help needed climbing 3-5 steps with a railing? : A Little 6 Click Score: 18    End of Session   Activity Tolerance: Other (comment) (limited by dizziness) Patient left: in bed;with call bell/phone within reach;with bed alarm set;with family/visitor present Nurse Communication: Mobility status PT Visit Diagnosis: Other abnormalities of gait and mobility (R26.89);Difficulty in walking, not elsewhere classified (R26.2)    Time: 9081-9069 PT Time Calculation (min) (ACUTE ONLY): 12 min   Charges:   PT Evaluation $PT Eval Low Complexity: 1 Low   PT General Charges $$ ACUTE PT VISIT: 1 Visit         Karlita Lichtman, PT, GCS 06/07/24,10:57 AM

## 2024-06-07 NOTE — Assessment & Plan Note (Addendum)
 Patient is mask intolerant

## 2024-06-07 NOTE — Assessment & Plan Note (Signed)
 Renal function at baseline Followed by [facility] nephrology, last seen June 2025

## 2024-06-07 NOTE — Discharge Summary (Signed)
 Cameron Klein FMW:[id number] DOB: 04/07/1961 DOA: 06/06/2024  PCP: System, Provider Not In  Admit date: 06/06/2024 Discharge date: 06/07/2024  Time spent: 35 minutes  Recommendations for Outpatient Follow-up:  Cardiology f/u 1 week (or pcp if unable to be seen by cardiologist)     Discharge Diagnoses:  Principal Problem:   Dizziness Active Problems:   Chronic HFrEF (heart failure with reduced ejection fraction) (HCC)   AICD (automatic cardioverter/defibrillator) present   Hemiparesis affecting left side as late effect of cerebrovascular accident (CVA) (HCC)   Right trigeminal neuralgia s/p microvascular decompression 08/2023   Coronary artery disease involving native coronary artery of native heart without angina pectoris   Other persistent atrial fibrillation (HCC)   Essential hypertension   CKD stage 3b, GFR 30-44 ml/min (HCC)   Diabetes mellitus, type II (HCC)   History of renal cell carcinoma s/p bilateral cryoablation 2017/2018   OSA (obstructive sleep apnea)   Seizure disorder (HCC)   MGUS (monoclonal gammopathy of unknown significance)   Discharge Condition: stable  Diet recommendation: heart healthy  Filed Weights   06/06/24 2015  Weight: 98.6 kg    History of present illness:  From admission h and p  Cameron Klein is a 63 y.o. male with medical history significant for Nonobstructive CAD, HFrEF(EF 30-35%) s/p AICD, s/p CardioMEMS, CVA with residual left-sided hemiparesis, A-fib on Eliquis , trigeminal neuralgia s/p MVD on prophylactic seizure meds, HTN, HLD, DM, CKD 3b,, RCC s/p bilateral cryoablation 2017 (on surveillance MRI), MGUS, untreated OSA, being admitted for workup of dizziness x 24 hours, initial CT head nonacute. History, as presented by ED provider who spoke with family states that symptoms started the day prior on 7/7 with episodes of lightheadedness and a single episode of right shoulder and neck pain.  On 7/8, the day of arrival, he had an episode of  right-sided chest pain that quickly dissipated and his lightheadedness continued and he had associated nausea and he appeared to be confused.  Patient describes the room as spinning.  Hospital Course:  Patient presents with an episode of lightheadedness at home that occurred shortly after standing. Symptoms appear to be orthostasis and patient certainly has multiple risk factors for this (hfref, multiple bp and cardiac meds, also history CVA with residual deficits). Here he is feeling mostly back to baseline, though still some lightheadedness when ambulating with PT (they advise home health PT which patient declines). Patient was evaluated with CT head, labs, CXR, EKG, and cardiac monitoring. All stable/unremarkable/normal. Patient has a cardiomems device and so volume is closely monitored at home, as such will not adjust home diuretics, but do advise holding home hydralazine and entresto for the time being, advise close f/u with cardiology (or with pcp if unable to be seen by cardiologist). Wife present at time of discharge planning and is in agreement with this plan.   Procedures: none   Consultations: none  Discharge Exam: Vitals:   06/07/24 0600 06/07/24 0803  BP: (!) 162/88 124/79  Pulse:  70  Resp:  16  Temp:  98.3 F (36.8 C)  SpO2:  98%    General: NAD Cardiovascular: distant heart sounds, rr, systolic murmur Respiratory: CTAB Ext: mild LE edema  Discharge Instructions   Discharge Instructions     Diet - low sodium heart healthy   Complete by: As directed    Increase activity slowly   Complete by: As directed       Allergies as of 06/07/2024       Reactions  Empagliflozin    Presented 11/30/22 with DKA.  This population of ketosis-prone type 2 diabetes patients typically have less recovery. 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   Topiramate         Medication List     PAUSE taking these  medications    hydrALAZINE 25 MG tablet Wait to take this until your doctor or other care provider tells you to start again. Commonly known as: APRESOLINE Take 25 mg by mouth 3 (three) times daily.   sacubitril-valsartan 97-103 MG Wait to take this until your doctor or other care provider tells you to start again. Commonly known as: ENTRESTO Take 1 tablet by mouth 2 (two) times daily.       STOP taking these medications    HumuLIN 70/30 KwikPen (70-30) 100 UNIT/ML KwikPen Generic drug: insulin  isophane & regular human KwikPen       TAKE these medications    acetaminophen  500 MG tablet Commonly known as: TYLENOL  Take 1,000 mg by mouth every 8 (eight) hours as needed for moderate pain (pain score 4-6) or mild pain (pain score 1-3).   bumetanide  1 MG tablet Commonly known as: BUMEX  Take 1 mg by mouth daily. Take 2 tablets in the morning and one tablet daily with lunch. May also take 1 tablet daily prn   dexamethasone 1 MG tablet Commonly known as: DECADRON Take 1 mg by mouth at bedtime as needed. Night before 8am bloodwork   DSS 100 MG Caps Take 100 mg by mouth 2 (two) times daily.   Eliquis  5 MG Tabs tablet Generic drug: apixaban  Take 5 mg by mouth 2 (two) times daily.   ezetimibe 10 MG tablet Commonly known as: ZETIA Take 10 mg by mouth daily.   isosorbide  dinitrate 30 MG tablet Commonly known as: ISORDIL  Take 30 mg by mouth 3 (three) times daily.   ketoconazole 2 % shampoo Commonly known as: NIZORAL Apply 1 Application topically as needed for irritation.   ketoconazole 2 % cream Commonly known as: NIZORAL Apply 1 Application topically daily as needed for irritation.   Lantus SoloStar 100 UNIT/ML Solostar Pen Generic drug: insulin  glargine Inject 0-0.5 Units into the skin as needed.   metoprolol  200 MG 24 hr tablet Commonly known as: TOPROL -XL Take 200 mg by mouth daily.   Oxcarbazepine  300 MG tablet Commonly known as: TRILEPTAL  Take 300 mg by  mouth 2 (two) times daily.   Ozempic (1 MG/DOSE) 4 MG/3ML Sopn Generic drug: Semaglutide (1 MG/DOSE) Inject 1 mg into the skin once a week.   phenytoin  100 MG ER capsule Commonly known as: DILANTIN  Take 200 mg by mouth at bedtime.   rosuvastatin 40 MG tablet Commonly known as: CRESTOR Take 40 mg by mouth daily.   spironolactone  25 MG tablet Commonly known as: ALDACTONE  Take 25 mg by mouth daily. Take 3 tablets in the morning and 2 tablets in the evening   triamcinolone cream 0.1 % Commonly known as: KENALOG Apply 1 Application topically as needed.   Vitamin D3 25 MCG (1000 UT) Caps Take 25 mcg by mouth as needed.       Allergies  Allergen Reactions   Empagliflozin     Presented 11/30/22 with DKA.  This population of ketosis-prone type 2 diabetes patients typically have less recovery. 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   Topiramate     Follow-up Information  Your [facility] cardiologist or PCP Follow up.   Why: within one week                 The results of significant diagnostics from this hospitalization (including imaging, microbiology, ancillary and laboratory) are listed below for reference.    Significant Diagnostic Studies: DG Chest Portable 1 View Result Date: 06/06/2024 CLINICAL DATA:  Right-sided chest pain EXAM: PORTABLE CHEST 1 VIEW COMPARISON:  Chest radiograph dated 03/10/2009 FINDINGS: Lines/tubes: Left chest wall ICD leads project over the right atrium and ventricle. Lungs: Well inflated lungs. No focal consolidation. Pleura: No pneumothorax or pleural effusion. Heart/mediastinum: Enlarged cardiomediastinal silhouette. Bones: No acute osseous abnormality. IMPRESSION: 1. Cardiomegaly. 2. No focal consolidation. Electronically Signed   By: Limin  Xu M.D.   On: 06/06/2024 20:50   CT Head Wo Contrast Result Date: 06/06/2024 CLINICAL DATA:  Syncope, dizziness EXAM: CT HEAD WITHOUT CONTRAST  TECHNIQUE: Contiguous axial images were obtained from the base of the skull through the vertex without intravenous contrast. RADIATION DOSE REDUCTION: This exam was performed according to the departmental dose-optimization program which includes automated exposure control, adjustment of the mA and/or kV according to patient size and/or use of iterative reconstruction technique. COMPARISON:  08/03/2010 FINDINGS: Brain: Large area of encephalomalacia in the right frontal lobe. There is atrophy and chronic small vessel disease changes. No acute intracranial abnormality. Specifically, no hemorrhage, hydrocephalus, mass lesion, acute infarction, or significant intracranial injury. Vascular: No hyperdense vessel or unexpected calcification. Skull: No acute calvarial abnormality. Sinuses/Orbits: No acute findings Other: None IMPRESSION: Large area encephalomalacia in the right frontal lobe likely from prior infarct. Atrophy, chronic microvascular disease. No acute intracranial abnormality. Electronically Signed   By: Franky Crease M.D.   On: 06/06/2024 19:54    Microbiology: No results found for this or any previous visit (from the past 240 hours).   Labs: Basic Metabolic Panel: Recent Labs  Lab 06/06/24 2236  NA 140  K 4.1  CL 103  CO2 25  GLUCOSE 136*  BUN 46*  CREATININE 2.08*  CALCIUM 8.8*   Liver Function Tests: No results for input(s): AST, ALT, ALKPHOS, BILITOT, PROT, ALBUMIN in the last 168 hours. No results for input(s): LIPASE, AMYLASE in the last 168 hours. No results for input(s): AMMONIA in the last 168 hours. CBC: Recent Labs  Lab 06/06/24 2236  WBC 9.2  HGB 12.2*  HCT 37.6*  MCV 90.0  PLT 174   Cardiac Enzymes: No results for input(s): CKTOTAL, CKMB, CKMBINDEX, TROPONINI in the last 168 hours. BNP: BNP (last 3 results) Recent Labs    06/06/24 2236  BNP 459.1*    ProBNP (last 3 results) No results for input(s): PROBNP in the last 8760  hours.  CBG: Recent Labs  Lab 06/07/24 0153  GLUCAP 130*       Signed:  Devaughn KATHEE Ban MD.  [facility] 06/07/2024, 11:16 AM

## 2024-06-07 NOTE — Assessment & Plan Note (Addendum)
 History of stroke History of trigeminal neuralgia s/p MVD 08/2023, on AEDs Etiology uncertain.  Differential includes stroke-outside tPA window CT head showing encephalomalacia, nonacute MRI ordered to evaluate for acute CVA--AICD compatible MRI available after 7 AM Continuous cardiac monitoring Neurologic checks Will allow permissive hypertension overnight Neurologic checks Symptomatic treatment for nausea and dizziness Continue antiepileptics for seizure prophylaxis

## 2024-06-07 NOTE — H&P (Signed)
 History and Physical    Patient: Cameron Klein FMW:[id number] DOB: 08/16/1961 DOA: 06/06/2024 DOS: the patient was seen and examined on 06/07/2024 PCP: System, Provider Not In  Patient coming from: Home  Chief Complaint:  Chief Complaint  Patient presents with   Dizziness    HPI: Cameron Klein is a 63 y.o. male with medical history significant for Nonobstructive CAD, HFrEF(EF 30-35%) s/p AICD, s/p CardioMEMS, CVA with residual left-sided hemiparesis, A-fib on Eliquis , trigeminal neuralgia s/p MVD on prophylactic seizure meds, HTN, HLD, DM, CKD 3b,, RCC s/p bilateral cryoablation 2017 (on surveillance MRI), MGUS, untreated OSA, being admitted for workup of dizziness x 24 hours, initial CT head nonacute. History, as presented by ED provider who spoke with family states that symptoms started the day prior on 7/7 with episodes of lightheadedness and a single episode of right shoulder and neck pain.  On 7/8, the day of arrival, he had an episode of right-sided chest pain that quickly dissipated and his lightheadedness continued and he had associated nausea and he appeared to be confused.  Patient describes the room as spinning. In the ED, vitals for the most part unremarkable. Labs: Troponin 51 ->21 and BNP 459 CBC and BMP at baseline, urinalysis sterileEKG showing paced rhythm at 71.  Chest x-ray with cardiomegaly otherwise nonacute chest x-ray showing large area of encephalomalacia in the right frontal lobe likely from prior infarct but nonacute. Unable to get AICD compatible MRI overnight. Admission requested.  No treatment administered in the ED    Past Medical History:  Diagnosis Date   Cancer of kidney (HCC)    CHF (congestive heart failure) (HCC)    Stroke [facility])    History reviewed. No pertinent surgical history. Social History:  reports that he has never smoked. He has never used smokeless tobacco. No history on file for alcohol use and drug use.  Not on File  History reviewed.  No pertinent family history.  Prior to Admission medications   Not on File    Physical Exam: Vitals:   06/06/24 1939 06/06/24 2015 06/06/24 2300 06/06/24 2330  BP: (!) 146/92  124/89 134/77  Pulse: (!) 58  (!) 110 64  Resp: 18  12 15   Temp: 98.7 F (37.1 C)     TempSrc: Oral     SpO2: 98%  98% 100%  Weight:  98.6 kg    Height:  5' 10 (1.778 m)     Physical Exam Vitals and nursing note reviewed.  Constitutional:      General: He is not in acute distress. HENT:     Head: Normocephalic and atraumatic.  Cardiovascular:     Rate and Rhythm: Normal rate and regular rhythm.     Heart sounds: Normal heart sounds.  Pulmonary:     Effort: Pulmonary effort is normal.     Breath sounds: Normal breath sounds.  Abdominal:     Palpations: Abdomen is soft.     Tenderness: There is no abdominal tenderness.  Neurological:     Mental Status: Mental status is at baseline.     Comments: Left hemiparesis     Labs on Admission: I have personally reviewed following labs and imaging studies  CBC: Recent Labs  Lab 06/06/24 2236  WBC 9.2  HGB 12.2*  HCT 37.6*  MCV 90.0  PLT 174   Basic Metabolic Panel: Recent Labs  Lab 06/06/24 2236  NA 140  K 4.1  CL 103  CO2 25  GLUCOSE 136*  BUN 46*  CREATININE  2.08*  CALCIUM 8.8*   GFR: Estimated Creatinine Clearance: 41.7 mL/min (A) (by C-G formula based on SCr of 2.08 mg/dL (H)). Liver Function Tests: No results for input(s): AST, ALT, ALKPHOS, BILITOT, PROT, ALBUMIN in the last 168 hours. No results for input(s): LIPASE, AMYLASE in the last 168 hours. No results for input(s): AMMONIA in the last 168 hours. Coagulation Profile: No results for input(s): INR, PROTIME in the last 168 hours. Cardiac Enzymes: No results for input(s): CKTOTAL, CKMB, CKMBINDEX, TROPONINI in the last 168 hours. BNP (last 3 results) No results for input(s): PROBNP in the last 8760 hours. HbA1C: No results for input(s):  HGBA1C in the last 72 hours. CBG: No results for input(s): GLUCAP in the last 168 hours. Lipid Profile: No results for input(s): CHOL, HDL, LDLCALC, TRIG, CHOLHDL, LDLDIRECT in the last 72 hours. Thyroid Function Tests: No results for input(s): TSH, T4TOTAL, FREET4, T3FREE, THYROIDAB in the last 72 hours. Anemia Panel: No results for input(s): VITAMINB12, FOLATE, FERRITIN, TIBC, IRON, RETICCTPCT in the last 72 hours. Urine analysis:    Component Value Date/Time   COLORURINE COLORLESS (A) 06/06/2024 2014   APPEARANCEUR CLEAR (A) 06/06/2024 2014   LABSPEC 1.008 06/06/2024 2014   PHURINE 5.0 06/06/2024 2014   GLUCOSEU NEGATIVE 06/06/2024 2014   HGBUR NEGATIVE 06/06/2024 2014   BILIRUBINUR NEGATIVE 06/06/2024 2014   KETONESUR NEGATIVE 06/06/2024 2014   PROTEINUR NEGATIVE 06/06/2024 2014   NITRITE NEGATIVE 06/06/2024 2014   LEUKOCYTESUR NEGATIVE 06/06/2024 2014    Radiological Exams on Admission: DG Chest Portable 1 View Result Date: 06/06/2024 CLINICAL DATA:  Right-sided chest pain EXAM: PORTABLE CHEST 1 VIEW COMPARISON:  Chest radiograph dated 03/10/2009 FINDINGS: Lines/tubes: Left chest wall ICD leads project over the right atrium and ventricle. Lungs: Well inflated lungs. No focal consolidation. Pleura: No pneumothorax or pleural effusion. Heart/mediastinum: Enlarged cardiomediastinal silhouette. Bones: No acute osseous abnormality. IMPRESSION: 1. Cardiomegaly. 2. No focal consolidation. Electronically Signed   By: Limin  Xu M.D.   On: 06/06/2024 20:50   CT Head Wo Contrast Result Date: 06/06/2024 CLINICAL DATA:  Syncope, dizziness EXAM: CT HEAD WITHOUT CONTRAST TECHNIQUE: Contiguous axial images were obtained from the base of the skull through the vertex without intravenous contrast. RADIATION DOSE REDUCTION: This exam was performed according to the departmental dose-optimization program which includes automated exposure control, adjustment of the mA  and/or kV according to patient size and/or use of iterative reconstruction technique. COMPARISON:  08/03/2010 FINDINGS: Brain: Large area of encephalomalacia in the right frontal lobe. There is atrophy and chronic small vessel disease changes. No acute intracranial abnormality. Specifically, no hemorrhage, hydrocephalus, mass lesion, acute infarction, or significant intracranial injury. Vascular: No hyperdense vessel or unexpected calcification. Skull: No acute calvarial abnormality. Sinuses/Orbits: No acute findings Other: None IMPRESSION: Large area encephalomalacia in the right frontal lobe likely from prior infarct. Atrophy, chronic microvascular disease. No acute intracranial abnormality. Electronically Signed   By: Franky Crease M.D.   On: 06/06/2024 19:54   Data Reviewed for HPI: Relevant notes from primary care and specialist visits, past discharge summaries as available in EHR, including Care Everywhere. Prior diagnostic testing as pertinent to current admission diagnoses Updated medications and problem lists for reconciliation ED course, including vitals, labs, imaging, treatment and response to treatment Triage notes, nursing and pharmacy notes and ED provider's notes Notable results as noted above in HPI      Assessment and Plan: * Dizziness History of stroke History of trigeminal neuralgia s/p MVD 08/2023, on AEDs Etiology uncertain.  Differential includes stroke-outside tPA window CT head showing encephalomalacia, nonacute MRI ordered to evaluate for acute CVA--AICD compatible MRI available after 7 AM Continuous cardiac monitoring Neurologic checks Will allow permissive hypertension overnight Neurologic checks Symptomatic treatment for nausea and dizziness Continue antiepileptics for seizure prophylaxis  Chronic HFrEF (heart failure with reduced ejection fraction) (HCC) S/p AICD S/p CardioMEMS Clinically euvolemic Continue GDMT-to resume Entresto, Bumex , metoprolol ,  spironolactone  and hydralazine, once stroke ruled out Followed by [facility] cardiology, last seen April 2025  Hemiparesis affecting left side as late effect of cerebrovascular accident (CVA) (HCC) Continue Eliquis  and statin  Right trigeminal neuralgia s/p microvascular decompression 08/2023 Continue Dilantin  and oxcarbazepine   Coronary artery disease involving native coronary artery of native heart without angina pectoris No complaints of chest pain Continue home meds  Other persistent atrial fibrillation (HCC) History of ablation Continue Eliquis   Essential hypertension Continue Entresto, Bumex , metoprolol , spironolactone  and hydralazine  CKD stage 3b, GFR 30-44 ml/min (HCC) Renal function at baseline Followed by [facility] nephrology, last seen June 2025  Diabetes mellitus, type II (HCC) Sliding scale insulin  coverage  History of renal cell carcinoma s/p bilateral cryoablation 2017/2018 On surveillance MRI No acute issues  OSA (obstructive sleep apnea) Patient is mask intolerant    DVT prophylaxis: Lovenox   Consults: none  Advance Care Planning: full code  Family Communication: Wife at bedside  Disposition Plan: Back to previous home environment  Severity of Illness: The appropriate patient status for this patient is OBSERVATION. Observation status is judged to be reasonable and necessary in order to provide the required intensity of service to ensure the patient's safety. The patient's presenting symptoms, physical exam findings, and initial radiographic and laboratory data in the context of their medical condition is felt to place them at decreased risk for further clinical deterioration. Furthermore, it is anticipated that the patient will be medically stable for discharge from the hospital within 2 midnights of admission.   Author: Delayne LULLA Solian, MD 06/07/2024 12:45 AM  For on call review [email or web address].

## 2024-06-07 NOTE — Assessment & Plan Note (Signed)
 Continue Entresto, Bumex , metoprolol , spironolactone  and hydralazine

## 2024-06-07 NOTE — Hospital Course (Signed)
 SABRA

## 2024-06-07 NOTE — Assessment & Plan Note (Signed)
 Continue Dilantin  and oxcarbazepine
